# Patient Record
Sex: Female | Born: 1995 | Race: White | Hispanic: No | Smoking: Former smoker
Health system: Southern US, Community
[De-identification: ages and names within clinical notes are randomized; demographics above are authoritative.]

## PROBLEM LIST (undated history)

## (undated) DIAGNOSIS — F419 Anxiety disorder, unspecified: Secondary | ICD-10-CM

## (undated) DIAGNOSIS — J45909 Unspecified asthma, uncomplicated: Secondary | ICD-10-CM

## (undated) DIAGNOSIS — F32A Depression, unspecified: Secondary | ICD-10-CM

## (undated) DIAGNOSIS — F329 Major depressive disorder, single episode, unspecified: Secondary | ICD-10-CM

## (undated) HISTORY — PX: NO PAST SURGERIES: SHX2092

## (undated) HISTORY — PX: WISDOM TOOTH EXTRACTION: SHX21

---
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
## 2017-03-28 NOTE — L&D Delivery Note (Signed)
Delivery Note At 6:34 PM a viable female infant was delivered via Vaginal, Spontaneous (Presentation: ROA).  APGAR: 7, 9; weight 7 lb 3.3 oz (3270 g).   Placenta status: delivered, spontaneously intact.  Cord: 3VC. with the following complications: none.  Cord pH: n/a  Anesthesia:  epidural Episiotomy: None Lacerations: 2nd degree;Perineal;Vaginal Suture Repair: 3.0 vicryl Est. Blood Loss (mL): 500  Mom to postpartum.  Baby to Couplet care / Skin to Skin.  Called to see patient.  Mom pushed to deliver a viable female infant.  The head followed by shoulders, which delivered without difficulty, and the rest of the body.  A single tight nuchal cord noted and delivered through.  Baby to mom's chest.  Cord clamped and cut after > 1 min delay.  Cord blood obtained.  Placenta delivered spontaneously, intact, with a 3-vessel cord.  Second degree perineal and vaginal lacerations repaired with 3-0 Vicryl in standard fashion.  All counts correct.  Hemostasis obtained with IV pitocin and fundal massage. EBL 500 mL.     Thomasene MohairStephen Maison Kestenbaum, MD 11/02/2017, 7:00 PM

## 2017-06-19 ENCOUNTER — Encounter: Payer: Self-pay | Admitting: Advanced Practice Midwife

## 2017-06-28 ENCOUNTER — Ambulatory Visit (INDEPENDENT_AMBULATORY_CARE_PROVIDER_SITE_OTHER): Payer: Medicaid Other | Admitting: Advanced Practice Midwife

## 2017-06-28 ENCOUNTER — Encounter: Payer: Self-pay | Admitting: Advanced Practice Midwife

## 2017-06-28 VITALS — BP 110/64 | Ht 66.0 in | Wt 128.0 lb

## 2017-06-28 DIAGNOSIS — O0990 Supervision of high risk pregnancy, unspecified, unspecified trimester: Secondary | ICD-10-CM

## 2017-06-28 DIAGNOSIS — Z113 Encounter for screening for infections with a predominantly sexual mode of transmission: Secondary | ICD-10-CM

## 2017-06-28 DIAGNOSIS — O0930 Supervision of pregnancy with insufficient antenatal care, unspecified trimester: Secondary | ICD-10-CM

## 2017-06-28 DIAGNOSIS — F332 Major depressive disorder, recurrent severe without psychotic features: Secondary | ICD-10-CM

## 2017-06-28 NOTE — Progress Notes (Signed)
New Obstetric Patient H&P    Chief Complaint: "Desires prenatal care"   History of Present Illness: Patient is a 22 y.o. G1P0 Not Hispanic or Latino female, presents with amenorrhea and positive home pregnancy test. Patient's last menstrual period was 02/14/2017 (lmp unknown). and based on her  LMP, her EDD is Estimated Date of Delivery: 11/21/17 and her EGA is [redacted]w[redacted]d. Cycles are 5. days, regular, and occur approximately every : 28 days. She has never had a PAP smear due to age.   She had a urine pregnancy test which was positive about 3 month(s)  ago. Her last menstrual period was normal and lasted for  4 or 5 day(s). Since her LMP she claims she has experienced fatigue, nausea, vomiting. She denies vaginal bleeding. Her past medical history is contributory for anxiety, depression, eating disorder. She has been off all meds for about 2 years and denies current problems with eating, depression or anxiety. Discussed hormonal mood disorders of pregnancy and stressed the importance of keeping [place] informed of worsening symptoms. This is her first pregnancy.  Since her LMP, she admits to the use of tobacco products  She quit when she found out she was pregnant She claims she has gained   13 pounds since the start of her pregnancy.  There are cats in the home in the home  yes If yes Indoor She admits close contact with children on a regular basis  no  She has had chicken pox in the past no She has had Tuberculosis exposures, symptoms, or previously tested positive for TB   no Current or past history of domestic violence. no  Genetic Screening/Teratology Counseling: (Includes patient, baby's father, or anyone in either family with:)   1. Patient's age >/= 47 at [facility]  no 2. Thalassemia ([place], [place], Mediterranean, or Asian background): MCV<80  no 3. Neural tube defect (meningomyelocele, spina bifida, anencephaly)  no 4. Congenital heart defect  Her brother may have had a heart defect 5. Down  syndrome  no 6. Tay-Sachs (Jewish, [place]  no 7. Canavan's Disease  no 8. Sickle cell disease or trait (African)  no  9. Hemophilia or other blood disorders  no  10. Muscular dystrophy  no  11. Cystic fibrosis  no  12. Huntington's Chorea  no  13. Mental retardation/autism  no 14. Other inherited genetic or chromosomal disorder  no 15. Maternal metabolic disorder (DM, PKU, etc)  no 16. Patient or FOB with a child with a birth defect not listed above no  16a. Patient or FOB with a birth defect themselves no 17. Recurrent pregnancy loss, or stillbirth  no  18. Any medications since LMP other than prenatal vitamins (include vitamins, supplements, OTC meds, drugs, alcohol)  no 19. Any other genetic/environmental exposure to discuss  no  Infection History:   1. Lives with someone with TB or TB exposed  no  2. Patient or partner has history of genital herpes  no 3. Rash or viral illness since LMP  no 4. History of STI (GC, CT, HPV, syphilis, HIV)  no 5. History of recent travel :  no  Other pertinent information:  no    Review of Systems:10 point review of systems negative unless otherwise noted in HPI  Past Medical History:  Past Medical History:  Diagnosis Date  . Anxiety   . Asthma    pt states she has not taken medications for this since age 69  . Depression     Past Surgical  History:  History reviewed. No pertinent surgical history.  Gynecologic History: Patient's last menstrual period was 02/14/2017 (lmp unknown).  Obstetric History: G1P0  Family History:  Family History  Adopted: Yes  Problem Relation Age of Onset  . Breast cancer Maternal Grandmother   . Diabetes Mellitus II Maternal Grandmother     Social History:  Social History   Socioeconomic History  . Marital status: Single    Spouse name: Not on file  . Number of children: Not on file  . Years of education: Not on file  . Highest education level: Not on file  Occupational History  .  Not on file  Social Needs  . Financial resource strain: Not on file  . Food insecurity:    Worry: Not on file    Inability: Not on file  . Transportation needs:    Medical: Not on file    Non-medical: Not on file  Tobacco Use  . Smoking status: Former Games developermoker  . Smokeless tobacco: Never Used  Substance and Sexual Activity  . Alcohol use: No  . Drug use: No  . Sexual activity: Yes    Birth control/protection: None  Lifestyle  . Physical activity:    Days per week: Not on file    Minutes per session: Not on file  . Stress: Not on file  Relationships  . Social connections:    Talks on phone: Not on file    Gets together: Not on file    Attends religious service: Not on file    Active member of club or organization: Not on file    Attends meetings of clubs or organizations: Not on file    Relationship status: Not on file  . Intimate partner violence:    Fear of current or ex partner: Not on file    Emotionally abused: Not on file    Physically abused: Not on file    Forced sexual activity: Not on file  Other Topics Concern  . Not on file  Social History Narrative  . Not on file    Allergies:  No Known Allergies  Medications: Prior to Admission medications   Not on File    Physical Exam Vitals: Blood pressure 110/64, weight 128 lb (58.1 kg), last menstrual period 03/13/2017.  General: NAD HEENT: normocephalic, anicteric Thyroid: no enlargement, no palpable nodules Pulmonary: No increased work of breathing, CTAB Cardiovascular: RRR, distal pulses 2+ Abdomen: NABS, soft, non-tender, non-distended.  Umbilicus without lesions.  No hepatomegaly, splenomegaly or masses palpable. No evidence of hernia. Fetal heart tones 150s Genitourinary:  External: Normal external female genitalia.  Normal urethral meatus, normal  Bartholin's and Skene's glands.    Vagina: Normal vaginal mucosa, no evidence of prolapse.    Cervix: Grossly normal in appearance, no bleeding, no  CMT  Uterus: Enlarged by external palpation at U    Adnexa: deferred for no symptoms/shared decision making  Rectal: deferred Extremities: no edema, erythema, or tenderness Neurologic: Grossly intact Psychiatric: mood appropriate, affect full   Assessment: 22 y.o. G1P0 at 166w1d presenting to initiate prenatal care  Plan: 1) Avoid alcoholic beverages. 2) Patient encouraged not to smoke.  3) Discontinue the use of all non-medicinal drugs and chemicals.  4) Take prenatal vitamins daily.  5) Nutrition, food safety (fish, cheese advisories, and high nitrite foods) and exercise discussed. 6) Hospital and practice style discussed with cross coverage system.  7) Genetic Screening, such as with 1st Trimester Screening, cell free fetal DNA, AFP testing, and Ultrasound, as  well as with amniocentesis and CVS as appropriate, is discussed with patient. At the conclusion of today's visit patient requested genetic testing 8) Patient is asked about travel to areas at risk for the Zika virus, and counseled to avoid travel and exposure to mosquitoes or sexual partners who may have themselves been exposed to the virus. Testing is discussed, and will be ordered as appropriate.  9 Anatomy scan in 1 week and can offer cfDNA following dating of pregnancy   Tresea Mall, [facility], [facility] 06/28/2017, 3:35 PM

## 2017-06-28 NOTE — Patient Instructions (Addendum)

## 2017-06-28 NOTE — Progress Notes (Signed)
NOB today. No vb. No lof. Pt unsure of LMP

## 2017-06-29 LAB — RPR+RH+ABO+RUB AB+AB SCR+CB...
ANTIBODY SCREEN: NEGATIVE
HEMATOCRIT: 33.7 % — AB (ref 34.0–46.6)
HIV Screen 4th Generation wRfx: NONREACTIVE
Hemoglobin: 11.4 g/dL (ref 11.1–15.9)
Hepatitis B Surface Ag: NEGATIVE
MCH: 30.2 pg (ref 26.6–33.0)
MCHC: 33.8 g/dL (ref 31.5–35.7)
MCV: 89 fL (ref 79–97)
Platelets: 300 10*3/uL (ref 150–379)
RBC: 3.78 x10E6/uL (ref 3.77–5.28)
RDW: 13.4 % (ref 12.3–15.4)
RH TYPE: POSITIVE
RPR Ser Ql: NONREACTIVE
Rubella Antibodies, IGG: 1.25 index (ref >0.99)
Varicella zoster IgG: 343 index (ref >165)
WBC: 6.6 10*3/uL (ref 3.4–10.8)

## 2017-07-02 LAB — URINE DRUG PANEL 7
Amphetamines, Urine: NEGATIVE ng/mL
BARBITURATE QUANT UR: NEGATIVE ng/mL
BENZODIAZEPINE QUANT UR: NEGATIVE ng/mL
CANNABINOID QUANT UR: POSITIVE — AB
Cocaine (Metab.): NEGATIVE ng/mL
OPIATE QUANT UR: NEGATIVE ng/mL
PCP Quant, Ur: NEGATIVE ng/mL

## 2017-07-02 LAB — CHLAMYDIA/GONOCOCCUS/TRICHOMONAS, NAA
Chlamydia by NAA: NEGATIVE
GONOCOCCUS BY NAA: NEGATIVE
Trich vag by NAA: NEGATIVE

## 2017-07-02 LAB — URINE CULTURE

## 2017-07-26 ENCOUNTER — Encounter: Payer: Self-pay | Admitting: Certified Nurse Midwife

## 2017-07-26 ENCOUNTER — Ambulatory Visit (INDEPENDENT_AMBULATORY_CARE_PROVIDER_SITE_OTHER): Payer: Medicaid Other

## 2017-07-26 ENCOUNTER — Ambulatory Visit (INDEPENDENT_AMBULATORY_CARE_PROVIDER_SITE_OTHER): Payer: Medicaid Other | Admitting: Certified Nurse Midwife

## 2017-07-26 VITALS — BP 102/58 | Wt 129.0 lb

## 2017-07-26 DIAGNOSIS — O0990 Supervision of high risk pregnancy, unspecified, unspecified trimester: Secondary | ICD-10-CM

## 2017-07-26 DIAGNOSIS — Z3A26 26 weeks gestation of pregnancy: Secondary | ICD-10-CM | POA: Diagnosis not present

## 2017-07-26 DIAGNOSIS — O0930 Supervision of pregnancy with insufficient antenatal care, unspecified trimester: Secondary | ICD-10-CM | POA: Diagnosis not present

## 2017-07-26 DIAGNOSIS — Z131 Encounter for screening for diabetes mellitus: Secondary | ICD-10-CM

## 2017-07-26 NOTE — Progress Notes (Signed)
ROB and anatomy scan at 23wk1d: +FM. Denies problems Reviewed lab results from prior visit. Did not discuss +MJ use because her father in room.\ CGA 26wk2day changing her EDC to 10/30/2017, normal but incomplete anatomy scan  Need face and cardiac views  Given information on prenatal classes ROB, 1 hour GTT, and FU anatomy scan in 2 weeks.  Farrel Conners, CNM

## 2017-07-26 NOTE — Progress Notes (Signed)
Pt reports no problems. Anatomy/dating ultrasound today. Gender surprise for now.

## 2017-08-09 ENCOUNTER — Other Ambulatory Visit: Payer: Medicaid Other

## 2017-08-09 ENCOUNTER — Ambulatory Visit (INDEPENDENT_AMBULATORY_CARE_PROVIDER_SITE_OTHER): Payer: Medicaid Other

## 2017-08-09 ENCOUNTER — Ambulatory Visit (INDEPENDENT_AMBULATORY_CARE_PROVIDER_SITE_OTHER): Payer: Medicaid Other | Admitting: Advanced Practice Midwife

## 2017-08-09 ENCOUNTER — Encounter: Payer: Self-pay | Admitting: Advanced Practice Midwife

## 2017-08-09 VITALS — BP 120/60 | Wt 135.0 lb

## 2017-08-09 DIAGNOSIS — O0990 Supervision of high risk pregnancy, unspecified, unspecified trimester: Secondary | ICD-10-CM

## 2017-08-09 DIAGNOSIS — Z3A28 28 weeks gestation of pregnancy: Secondary | ICD-10-CM

## 2017-08-09 DIAGNOSIS — O0993 Supervision of high risk pregnancy, unspecified, third trimester: Secondary | ICD-10-CM | POA: Diagnosis not present

## 2017-08-09 DIAGNOSIS — Z131 Encounter for screening for diabetes mellitus: Secondary | ICD-10-CM

## 2017-08-09 NOTE — Patient Instructions (Signed)
Third Trimester of Pregnancy The third trimester is from week 28 through week 40 (months 7 through 9). The third trimester is a time when the unborn baby (fetus) is growing rapidly. At the end of the ninth month, the fetus is about 20 inches in length and weighs 6-10 pounds. Body changes during your third trimester Your body will continue to go through many changes during pregnancy. The changes vary from woman to woman. During the third trimester:  Your weight will continue to increase. You can expect to gain 25-35 pounds (11-16 kg) by the end of the pregnancy.  You may begin to get stretch marks on your hips, abdomen, and breasts.  You may urinate more often because the fetus is moving lower into your pelvis and pressing on your bladder.  You may develop or continue to have heartburn. This is caused by increased hormones that slow down muscles in the digestive tract.  You may develop or continue to have constipation because increased hormones slow digestion and cause the muscles that push waste through your intestines to relax.  You may develop hemorrhoids. These are swollen veins (varicose veins) in the rectum that can itch or be painful.  You may develop swollen, bulging veins (varicose veins) in your legs.  You may have increased body aches in the pelvis, back, or thighs. This is due to weight gain and increased hormones that are relaxing your joints.  You may have changes in your hair. These can include thickening of your hair, rapid growth, and changes in texture. Some women also have hair loss during or after pregnancy, or hair that feels dry or thin. Your hair will most likely return to normal after your baby is born.  Your breasts will continue to grow and they will continue to become tender. A yellow fluid (colostrum) may leak from your breasts. This is the first milk you are producing for your baby.  Your belly button may stick out.  You may notice more swelling in your hands,  face, or ankles.  You may have increased tingling or numbness in your hands, arms, and legs. The skin on your belly may also feel numb.  You may feel short of breath because of your expanding uterus.  You may have more problems sleeping. This can be caused by the size of your belly, increased need to urinate, and an increase in your body's metabolism.  You may notice the fetus "dropping," or moving lower in your abdomen (lightening).  You may have increased vaginal discharge.  You may notice your joints feel loose and you may have pain around your pelvic bone.  What to expect at prenatal visits You will have prenatal exams every 2 weeks until week 36. Then you will have weekly prenatal exams. During a routine prenatal visit:  You will be weighed to make sure you and the baby are growing normally.  Your blood pressure will be taken.  Your abdomen will be measured to track your baby's growth.  The fetal heartbeat will be listened to.  Any test results from the previous visit will be discussed.  You may have a cervical check near your due date to see if your cervix has softened or thinned (effaced).  You will be tested for Group B streptococcus. This happens between 35 and 37 weeks.  Your health care provider may ask you:  What your birth plan is.  How you are feeling.  If you are feeling the baby move.  If you have had   any abnormal symptoms, such as leaking fluid, bleeding, severe headaches, or abdominal cramping.  If you are using any tobacco products, including cigarettes, chewing tobacco, and electronic cigarettes.  If you have any questions.  Other tests or screenings that may be performed during your third trimester include:  Blood tests that check for low iron levels (anemia).  Fetal testing to check the health, activity level, and growth of the fetus. Testing is done if you have certain medical conditions or if there are problems during the  pregnancy.  Nonstress test (NST). This test checks the health of your baby to make sure there are no signs of problems, such as the baby not getting enough oxygen. During this test, a belt is placed around your belly. The baby is made to move, and its heart rate is monitored during movement.  What is false labor? False labor is a condition in which you feel small, irregular tightenings of the muscles in the womb (contractions) that usually go away with rest, changing position, or drinking water. These are called Braxton Hicks contractions. Contractions may last for hours, days, or even weeks before true labor sets in. If contractions come at regular intervals, become more frequent, increase in intensity, or become painful, you should see your health care provider. What are the signs of labor?  Abdominal cramps.  Regular contractions that start at 10 minutes apart and become stronger and more frequent with time.  Contractions that start on the top of the uterus and spread down to the lower abdomen and back.  Increased pelvic pressure and dull back pain.  A watery or bloody mucus discharge that comes from the vagina.  Leaking of amniotic fluid. This is also known as your "water breaking." It could be a slow trickle or a gush. Let your health care provider know if it has a color or strange odor. If you have any of these signs, call your health care provider right away, even if it is before your due date. Follow these instructions at home: Medicines  Follow your health care provider's instructions regarding medicine use. Specific medicines may be either safe or unsafe to take during pregnancy.  Take a prenatal vitamin that contains at least 600 micrograms (mcg) of folic acid.  If you develop constipation, try taking a stool softener if your health care provider approves. Eating and drinking  Eat a balanced diet that includes fresh fruits and vegetables, whole grains, good sources of protein  such as meat, eggs, or tofu, and low-fat dairy. Your health care provider will help you determine the amount of weight gain that is right for you.  Avoid raw meat and uncooked cheese. These carry germs that can cause birth defects in the baby.  If you have low calcium intake from food, talk to your health care provider about whether you should take a daily calcium supplement.  Eat four or five small meals rather than three large meals a day.  Limit foods that are high in fat and processed sugars, such as fried and sweet foods.  To prevent constipation: ? Drink enough fluid to keep your urine clear or pale yellow. ? Eat foods that are high in fiber, such as fresh fruits and vegetables, whole grains, and beans. Activity  Exercise only as directed by your health care provider. Most women can continue their usual exercise routine during pregnancy. Try to exercise for 30 minutes at least 5 days a week. Stop exercising if you experience uterine contractions.  Avoid heavy   lifting.  Do not exercise in extreme heat or humidity, or at high altitudes.  Wear low-heel, comfortable shoes.  Practice good posture.  You may continue to have sex unless your health care provider tells you otherwise. Relieving pain and discomfort  Take frequent breaks and rest with your legs elevated if you have leg cramps or low back pain.  Take warm sitz baths to soothe any pain or discomfort caused by hemorrhoids. Use hemorrhoid cream if your health care provider approves.  Wear a good support bra to prevent discomfort from breast tenderness.  If you develop varicose veins: ? Wear support pantyhose or compression stockings as told by your healthcare provider. ? Elevate your feet for 15 minutes, 3-4 times a day. Prenatal care  Write down your questions. Take them to your prenatal visits.  Keep all your prenatal visits as told by your health care provider. This is important. Safety  Wear your seat belt at  all times when driving.  Make a list of emergency phone numbers, including numbers for family, friends, the hospital, and police and fire departments. General instructions  Avoid cat litter boxes and soil used by cats. These carry germs that can cause birth defects in the baby. If you have a cat, ask someone to clean the litter box for you.  Do not travel far distances unless it is absolutely necessary and only with the approval of your health care provider.  Do not use hot tubs, steam rooms, or saunas.  Do not drink alcohol.  Do not use any products that contain nicotine or tobacco, such as cigarettes and e-cigarettes. If you need help quitting, ask your health care provider.  Do not use any medicinal herbs or unprescribed drugs. These chemicals affect the formation and growth of the baby.  Do not douche or use tampons or scented sanitary pads.  Do not cross your legs for long periods of time.  To prepare for the arrival of your baby: ? Take prenatal classes to understand, practice, and ask questions about labor and delivery. ? Make a trial run to the hospital. ? Visit the hospital and tour the maternity area. ? Arrange for maternity or paternity leave through employers. ? Arrange for family and friends to take care of pets while you are in the hospital. ? Purchase a rear-facing car seat and make sure you know how to install it in your car. ? Pack your hospital bag. ? Prepare the baby's nursery. Make sure to remove all pillows and stuffed animals from the baby's crib to prevent suffocation.  Visit your dentist if you have not gone during your pregnancy. Use a soft toothbrush to brush your teeth and be gentle when you floss. Contact a health care provider if:  You are unsure if you are in labor or if your water has broken.  You become dizzy.  You have mild pelvic cramps, pelvic pressure, or nagging pain in your abdominal area.  You have lower back pain.  You have persistent  nausea, vomiting, or diarrhea.  You have an unusual or bad smelling vaginal discharge.  You have pain when you urinate. Get help right away if:  Your water breaks before 37 weeks.  You have regular contractions less than 5 minutes apart before 37 weeks.  You have a fever.  You are leaking fluid from your vagina.  You have spotting or bleeding from your vagina.  You have severe abdominal pain or cramping.  You have rapid weight loss or weight gain.    You have shortness of breath with chest pain.  You notice sudden or extreme swelling of your face, hands, ankles, feet, or legs.  Your baby makes fewer than 10 movements in 2 hours.  You have severe headaches that do not go away when you take medicine.  You have vision changes. Summary  The third trimester is from week 28 through week 40, months 7 through 9. The third trimester is a time when the unborn baby (fetus) is growing rapidly.  During the third trimester, your discomfort may increase as you and your baby continue to gain weight. You may have abdominal, leg, and back pain, sleeping problems, and an increased need to urinate.  During the third trimester your breasts will keep growing and they will continue to become tender. A yellow fluid (colostrum) may leak from your breasts. This is the first milk you are producing for your baby.  False labor is a condition in which you feel small, irregular tightenings of the muscles in the womb (contractions) that eventually go away. These are called Braxton Hicks contractions. Contractions may last for hours, days, or even weeks before true labor sets in.  Signs of labor can include: abdominal cramps; regular contractions that start at 10 minutes apart and become stronger and more frequent with time; watery or bloody mucus discharge that comes from the vagina; increased pelvic pressure and dull back pain; and leaking of amniotic fluid. This information is not intended to replace advice  given to you by your health care provider. Make sure you discuss any questions you have with your health care provider. Document Released: 03/08/2001 Document Revised: 08/20/2015 Document Reviewed: 05/15/2012 Elsevier Interactive Patient Education  2017 [employer]vier Inc.  

## 2017-08-09 NOTE — Progress Notes (Signed)
  Routine Prenatal Care Visit  Subjective  Amy [name] is a 22 y.o. G1P0 at [redacted]w[redacted]d being seen today for ongoing prenatal care.  She is currently monitored for the following issues for this high-risk pregnancy and has Severe recurrent major depression without psychotic features (HCC); Eating disorder; Generalized anxiety disorder; Supervision of high risk pregnancy, antepartum; and Late prenatal care on their problem list.  ----------------------------------------------------------------------------------- Patient reports no complaints.  She admits to having an appetite and no problems with eating. She denies continued marijuana use. Contractions: Not present. Vag. Bleeding: None.  Movement: Present. Denies leaking of fluid.  ----------------------------------------------------------------------------------- The following portions of the patient's history were reviewed and updated as appropriate: allergies, current medications, past family history, past medical history, past social history, past surgical history and problem list. Problem list updated.   Objective  Blood pressure 120/60, weight 135 lb (61.2 kg), last menstrual period 02/14/2017. Pregravid weight 115 lb (52.2 kg) Total Weight Gain 20 lb (9.072 kg) Urinalysis:    dark yellow  Fetal Status: Fetal Heart Rate (bpm): 146 Fundal Height: 28 cm Movement: Present  Presentation: Vertex  Anatomy is complete but sub-optimal for profile due to position of fetus in pelvis. Comment on scan: placenta grade 2.  General:  Alert, oriented and cooperative. Patient is in no acute distress.  Skin: Skin is warm and dry. No rash noted.   Cardiovascular: Normal heart rate noted  Respiratory: Normal respiratory effort, no problems with respiration noted  Abdomen: Soft, gravid, appropriate for gestational age. Pain/Pressure: Present     Pelvic:  Cervical exam deferred        Extremities: Normal range of motion.  Edema: None  Mental Status:  Normal mood and affect. Normal behavior. Normal judgment and thought content.   Assessment   22 y.o. G1P0 at [redacted]w[redacted]d by  10/30/2017, by Ultrasound presenting for routine prenatal visit  Plan   pregnancy Problems (from 06/28/17 to present)    No problems associated with this episode.       Preterm labor symptoms and general obstetric precautions including but not limited to vaginal bleeding, contractions, leaking of fluid and fetal movement were reviewed in detail with the patient. Please refer to After Visit Summary for other counseling recommendations.  Recommend increased hydration. Recommend hands/knees or chest/knees exercises for fetal positioning  Return in about 2 weeks (around 08/23/2017) for f/u growth/anatomy and rob.  Tresea Mall, CNM 08/09/2017 11:23 AM

## 2017-08-10 LAB — GLUCOSE, 1 HOUR GESTATIONAL: Gestational Diabetes Screen: 129 mg/dL (ref 65–139)

## 2017-08-23 ENCOUNTER — Encounter: Payer: Self-pay | Admitting: Maternal Newborn

## 2017-08-23 ENCOUNTER — Ambulatory Visit (INDEPENDENT_AMBULATORY_CARE_PROVIDER_SITE_OTHER): Payer: Medicaid Other

## 2017-08-23 ENCOUNTER — Ambulatory Visit (INDEPENDENT_AMBULATORY_CARE_PROVIDER_SITE_OTHER): Payer: Medicaid Other | Admitting: Maternal Newborn

## 2017-08-23 VITALS — BP 100/60 | Wt 135.0 lb

## 2017-08-23 DIAGNOSIS — O0993 Supervision of high risk pregnancy, unspecified, third trimester: Secondary | ICD-10-CM

## 2017-08-23 DIAGNOSIS — Z3A30 30 weeks gestation of pregnancy: Secondary | ICD-10-CM

## 2017-08-23 DIAGNOSIS — O0990 Supervision of high risk pregnancy, unspecified, unspecified trimester: Secondary | ICD-10-CM

## 2017-08-23 DIAGNOSIS — Z3689 Encounter for other specified antenatal screening: Secondary | ICD-10-CM

## 2017-08-23 NOTE — Progress Notes (Addendum)
Routine Prenatal Care Visit  Subjective  Amy [name] is a 22 y.o. G1P0 at [redacted]w[redacted]d being seen today for ongoing prenatal care.  She is currently monitored for the following issues for this high-risk pregnancy and has Severe recurrent major depression without psychotic features (HCC); Eating disorder; Generalized anxiety disorder; Supervision of high risk pregnancy, antepartum; and Late prenatal care on their problem list.  ----------------------------------------------------------------------------------- Patient reports no complaints.   Contractions: Not present. Vag. Bleeding: None.  Movement: Present. No leaking of fluid.  ----------------------------------------------------------------------------------- The following portions of the patient's history were reviewed and updated as appropriate: allergies, current medications, past family history, past medical history, past social history, past surgical history and problem list. Problem list updated.   Objective  Blood pressure 100/60, weight 135 lb (61.2 kg), last menstrual period 02/14/2017. Pregravid weight 115 lb (52.2 kg) Total Weight Gain 20 lb (9.072 kg) Urinalysis: Urine Protein: Negative Urine Glucose: Negative  Fetal Status: Fetal Heart Rate (bpm): 142 Fundal Height: 29 cm Movement: Present     General:  Alert, oriented and cooperative. Patient is in no acute distress.  Skin: Skin is warm and dry. No rash noted.   Cardiovascular: Normal heart rate noted  Respiratory: Normal respiratory effort, no problems with respiration noted  Abdomen: Soft, gravid, appropriate for gestational age. Pain/Pressure: Absent     Pelvic:  Cervical exam deferred        Extremities: Normal range of motion.  Edema: None  Mental Status: Normal mood and affect. Normal behavior. Normal judgment and thought content.     Assessment   22 y.o. G1P0 at [redacted]w[redacted]d, EDD 10/30/2017 by Ultrasound presenting for routine prenatal visit.  Plan    pregnancy Problems (from 06/28/17 to present)    Problem Noted Resolved   Supervision of high risk pregnancy, antepartum 07/26/2017 by Farrel Conners, CNM No   Overview Addendum 08/10/2017  7:26 PM by Farrel Conners, CNM     Clinic  Prenatal Labs  Dating 26wk2d ultrasound Blood type: O/Positive/-- (04/03 1507)   Genetic Screen 1 Screen:    AFP:     Quad:     NIPS: Antibody:Negative (04/03 1507)  Anatomic [place]  Rubella: 1.25 (04/03 1507) Varicella Immune  GTT Early:               Third trimester: 129 RPR: Non Reactive (04/03 1507)   Flu vaccine  HBsAg: Negative (04/03 1507)   TDaP vaccine                                               Rhogam: HIV: Non Reactive (04/03 1507)   Baby Food                                               GBS: (For PCN allergy, check sensitivities)  Contraception  Pap:  Circumcision  +MJ on UDS 4/3  Pediatrician    Support Person              Follow up anatomy scan today is still incomplete for profile. Growth scan not done today, check with next visit in two weeks. Discussed TDaP, she says she will accept the vaccine next visit.  Preterm labor symptoms and general obstetric precautions including  but not limited to vaginal bleeding, contractions, leaking of fluid and fetal movement were reviewed.  Return in about 2 weeks (around 09/06/2017) for ROB with growth ultrasound.  Marcelyn Bruins, CNM 08/23/2017  3:06 PM

## 2017-08-23 NOTE — Progress Notes (Signed)
No [email or web address] 

## 2017-08-28 ENCOUNTER — Other Ambulatory Visit: Payer: Self-pay | Admitting: Obstetrics and Gynecology

## 2017-08-28 DIAGNOSIS — Z3480 Encounter for supervision of other normal pregnancy, unspecified trimester: Secondary | ICD-10-CM

## 2017-08-28 NOTE — Progress Notes (Signed)
Called patient and discussed that we will refer her to MFM because profile of baby has not been able to be visualized yet. Patient agreed.  Amy Idlerhristanna Destane Speas MD [facility], [facility] 08/28/17 3:04 PM

## 2017-09-04 ENCOUNTER — Other Ambulatory Visit: Payer: Self-pay | Admitting: *Deleted

## 2017-09-04 ENCOUNTER — Ambulatory Visit
Admission: RE | Admit: 2017-09-04 | Discharge: 2017-09-04 | Disposition: A | Discharge status: Home / Self Care | Payer: Medicaid Other | Source: Ambulatory Visit | Attending: Obstetrics and Gynecology | Admitting: Obstetrics and Gynecology

## 2017-09-04 DIAGNOSIS — O0930 Supervision of pregnancy with insufficient antenatal care, unspecified trimester: Secondary | ICD-10-CM

## 2017-09-04 DIAGNOSIS — O0990 Supervision of high risk pregnancy, unspecified, unspecified trimester: Secondary | ICD-10-CM

## 2017-09-04 DIAGNOSIS — Z3480 Encounter for supervision of other normal pregnancy, unspecified trimester: Secondary | ICD-10-CM

## 2017-09-06 ENCOUNTER — Other Ambulatory Visit: Payer: Medicaid Other

## 2017-09-06 ENCOUNTER — Encounter: Payer: Medicaid Other | Admitting: Obstetrics and Gynecology

## 2017-09-11 ENCOUNTER — Ambulatory Visit: Payer: Medicaid Other

## 2017-09-11 ENCOUNTER — Encounter: Payer: Self-pay | Admitting: Obstetrics and Gynecology

## 2017-09-11 ENCOUNTER — Ambulatory Visit (INDEPENDENT_AMBULATORY_CARE_PROVIDER_SITE_OTHER): Payer: Medicaid Other | Admitting: Obstetrics and Gynecology

## 2017-09-11 DIAGNOSIS — O0930 Supervision of pregnancy with insufficient antenatal care, unspecified trimester: Secondary | ICD-10-CM

## 2017-09-11 DIAGNOSIS — O0990 Supervision of high risk pregnancy, unspecified, unspecified trimester: Secondary | ICD-10-CM

## 2017-09-11 DIAGNOSIS — O99323 Drug use complicating pregnancy, third trimester: Secondary | ICD-10-CM

## 2017-09-11 DIAGNOSIS — O0993 Supervision of high risk pregnancy, unspecified, third trimester: Secondary | ICD-10-CM

## 2017-09-11 DIAGNOSIS — O0933 Supervision of pregnancy with insufficient antenatal care, third trimester: Secondary | ICD-10-CM

## 2017-09-11 DIAGNOSIS — O26843 Uterine size-date discrepancy, third trimester: Secondary | ICD-10-CM

## 2017-09-11 DIAGNOSIS — Z3A33 33 weeks gestation of pregnancy: Secondary | ICD-10-CM

## 2017-09-11 NOTE — Progress Notes (Signed)
Routine Prenatal Care Visit  Subjective  Amy [name] is a 22 y.o. G1P0 at [redacted]w[redacted]d being seen today for ongoing prenatal care.  She is currently monitored for the following issues for this high-risk pregnancy and has Severe recurrent major depression without psychotic features (HCC); Eating disorder; Generalized anxiety disorder; Supervision of high risk pregnancy, antepartum; and Late prenatal care on their problem list.  ----------------------------------------------------------------------------------- Patient reports no complaints.   Contractions: Not present. Vag. Bleeding: None.  Movement: Present. Denies leaking of fluid.  ----------------------------------------------------------------------------------- The following portions of the patient's history were reviewed and updated as appropriate: allergies, current medications, past family history, past medical history, past social history, past surgical history and problem list. Problem list updated.   Objective  Blood pressure 100/60, weight 140 lb (63.5 kg), last menstrual period 02/14/2017. Pregravid weight 115 lb (52.2 kg) Total Weight Gain 25 lb (11.3 kg) Urinalysis: Urine Protein: Negative Urine Glucose: Negative  Fetal Status: Fetal Heart Rate (bpm): 140 Fundal Height: 31 cm Movement: Present  Presentation: Vertex  General:  Alert, oriented and cooperative. Patient is in no acute distress.  Skin: Skin is warm and dry. No rash noted.   Cardiovascular: Normal heart rate noted  Respiratory: Normal respiratory effort, no problems with respiration noted  Abdomen: Soft, gravid, appropriate for gestational age. Pain/Pressure: Present     Pelvic:  Cervical exam deferred        Extremities: Normal range of motion.     ental Status: Normal mood and affect. Normal behavior. Normal judgment and thought content.     Assessment   22 y.o. G1P0 at [redacted]w[redacted]d by  10/30/2017, by Ultrasound presenting for routine prenatal  visit  Plan   pregnancy Problems (from 06/28/17 to present)    Problem Noted Resolved   Supervision of high risk pregnancy, antepartum 07/26/2017 by Farrel Conners, CNM No   Overview Addendum 09/11/2017 12:02 PM by Natale Milch, MD     Clinic [facility] Prenatal Labs  Dating 26wk2d ultrasound Blood type: O/Positive/-- (04/03 1507)   Genetic Screen 1 Screen:    AFP:     Quad:     NIPS: Antibody:Negative (04/03 1507)  Anatomic [place] complete Rubella: 1.25 (04/03 1507) Varicella Immune  GTT Early:               Third trimester: 129 RPR: Non Reactive (04/03 1507)   Flu vaccine  HBsAg: Negative (04/03 1507)   TDaP vaccine 09/11/17                                               Rhogam: not needed HIV: Non Reactive (04/03 1507)   Baby Food  Breast                                             GBS: (For PCN allergy, check sensitivities)  Contraception  undecided, given information Pap:  Circumcision  +MJ on UDS 4/3  Pediatrician    Support Person                 Gestational age appropriate obstetric precautions including but not limited to vaginal bleeding, contractions, leaking of fluid and fetal movement were reviewed in detail with the patient.    Tdap today. Given  information on birthcontrol options postpartum. Recommended [email or web address]. She is unsure what method she desires to use right now. Discussed breast feeding. Patient is planning on breast feeding. Patient was given resources and lactation consultation was advised.   Return in about 2 weeks (around 09/25/2017) for ROB and US.   Adelene Idlerhristanna Shoshanna Mcquitty MD [facility], [facility] 09/11/17 12:03 PM

## 2017-09-11 NOTE — Progress Notes (Signed)
Pt reports no problems. Pt to receive tdap at [facility] due to insurance and age. Blood consent today.

## 2017-09-12 LAB — URINE DRUG PANEL 7
AMPHETAMINES, URINE: NEGATIVE ng/mL
BARBITURATE QUANT UR: NEGATIVE ng/mL
BENZODIAZEPINE QUANT UR: NEGATIVE ng/mL
COCAINE (METAB.): NEGATIVE ng/mL
Cannabinoid Quant, Ur: NEGATIVE ng/mL
Opiate Quant, Ur: NEGATIVE ng/mL
PCP Quant, Ur: NEGATIVE ng/mL

## 2017-09-25 ENCOUNTER — Encounter: Payer: Medicaid Other | Admitting: Obstetrics and Gynecology

## 2017-09-25 ENCOUNTER — Other Ambulatory Visit: Payer: Medicaid Other

## 2017-09-27 ENCOUNTER — Ambulatory Visit (INDEPENDENT_AMBULATORY_CARE_PROVIDER_SITE_OTHER): Payer: Medicaid Other

## 2017-09-27 ENCOUNTER — Ambulatory Visit (INDEPENDENT_AMBULATORY_CARE_PROVIDER_SITE_OTHER): Payer: Medicaid Other | Admitting: Certified Nurse Midwife

## 2017-09-27 ENCOUNTER — Other Ambulatory Visit: Payer: Self-pay | Admitting: Obstetrics and Gynecology

## 2017-09-27 VITALS — BP 100/60 | Wt 142.0 lb

## 2017-09-27 DIAGNOSIS — Z3A35 35 weeks gestation of pregnancy: Secondary | ICD-10-CM

## 2017-09-27 DIAGNOSIS — O365930 Maternal care for other known or suspected poor fetal growth, third trimester, not applicable or unspecified: Secondary | ICD-10-CM | POA: Diagnosis not present

## 2017-09-27 DIAGNOSIS — O0990 Supervision of high risk pregnancy, unspecified, unspecified trimester: Secondary | ICD-10-CM

## 2017-09-27 NOTE — Progress Notes (Signed)
Pt reports no problems. F/u growth u/s today.

## 2017-09-28 NOTE — Progress Notes (Signed)
ROB/ growth scan at 35wk2d: EFW 30th% (5#5oz) with AC<5th%, normal Dopplers and normal AFI =16.76cm, grade 3 placenta. [facility] EFW 29th% (3#15oz) with AC at the 10th% and grade 3 placenta. Patient reports good FM. Does not smoke cigarettes. Last UDS negative 09/10/17 Consulted Dr Jerene PitchSchuman: no APTing at this time Having BH contractions Discussed labor precautions, preregistration. ROB 1 week  Amy Connersolleen Seraya [name], CNM

## 2017-10-04 ENCOUNTER — Encounter: Payer: Self-pay | Admitting: Advanced Practice Midwife

## 2017-10-04 ENCOUNTER — Other Ambulatory Visit (HOSPITAL_COMMUNITY)
Admission: RE | Admit: 2017-10-04 | Discharge: 2017-10-04 | Disposition: A | Discharge status: Home / Self Care | Payer: Medicaid Other | Source: Ambulatory Visit | Attending: Advanced Practice Midwife | Admitting: Advanced Practice Midwife

## 2017-10-04 ENCOUNTER — Ambulatory Visit (INDEPENDENT_AMBULATORY_CARE_PROVIDER_SITE_OTHER): Payer: Medicaid Other | Admitting: Advanced Practice Midwife

## 2017-10-04 VITALS — BP 116/62 | Wt 149.0 lb

## 2017-10-04 DIAGNOSIS — Z3A36 36 weeks gestation of pregnancy: Secondary | ICD-10-CM

## 2017-10-04 DIAGNOSIS — Z113 Encounter for screening for infections with a predominantly sexual mode of transmission: Secondary | ICD-10-CM | POA: Diagnosis present

## 2017-10-04 DIAGNOSIS — Z3685 Encounter for antenatal screening for Streptococcus B: Secondary | ICD-10-CM

## 2017-10-04 NOTE — Progress Notes (Signed)
Routine Prenatal Care Visit  Subjective  Amy [name] is a 22 y.o. G1P0 at 4138w2d being seen today for ongoing prenatal care.  She is currently monitored for the following issues for this high-risk pregnancy and has Severe recurrent major depression without psychotic features (HCC); Eating disorder; Generalized anxiety disorder; Supervision of high risk pregnancy, antepartum; and Late prenatal care on their problem list.  ----------------------------------------------------------------------------------- Patient reports no complaints.   Contractions: Not present. Vag. Bleeding: None.  Movement: Present. Denies leaking of fluid.  ----------------------------------------------------------------------------------- The following portions of the patient's history were reviewed and updated as appropriate: allergies, current medications, past family history, past medical history, past social history, past surgical history and problem list. Problem list updated.   Objective  Blood pressure 116/62, weight 149 lb (67.6 kg), last menstrual period 02/14/2017. Pregravid weight 115 lb (52.2 kg) Total Weight Gain 34 lb (15.4 kg) Urinalysis:      Fetal Status: Fetal Heart Rate (bpm): 138 Fundal Height: 35 cm Movement: Present     General:  Alert, oriented and cooperative. Patient is in no acute distress.  Skin: Skin is warm and dry. No rash noted.   Cardiovascular: Normal heart rate noted  Respiratory: Normal respiratory effort, no problems with respiration noted  Abdomen: Soft, gravid, appropriate for gestational age. Pain/Pressure: Absent     Pelvic:  GBS/aptima swabs collected        Extremities: Normal range of motion.  Edema: None  Mental Status: Normal mood and affect. Normal behavior. Normal judgment and thought content.   Assessment   22 y.o. G1P0 at 2338w2d by  10/30/2017, by Ultrasound presenting for routine prenatal visit  Plan   pregnancy Problems (from 06/28/17 to present)      Problem Noted Resolved   Supervision of high risk pregnancy, antepartum 07/26/2017 by Farrel ConnersGutierrez, Colleen, CNM No   Overview Addendum 09/11/2017 12:02 PM by Natale MilchSchuman, Christanna R, MD     Clinic [facility] Prenatal Labs  Dating 26wk2d ultrasound Blood type: O/Positive/-- (04/03 1507)   Genetic Screen 1 Screen:    AFP:     Quad:     NIPS: Antibody:Negative (04/03 1507)  Anatomic US complete Rubella: 1.25 (04/03 1507) Varicella Immune  GTT Early:               Third trimester: 129 RPR: Non Reactive (04/03 1507)   Flu vaccine  HBsAg: Negative (04/03 1507)   TDaP vaccine 09/11/17                                               Rhogam: not needed HIV: Non Reactive (04/03 1507)   Baby Food  Breast                                             GBS: (For PCN allergy, check sensitivities)  Contraception  undecided, given information Pap:  Circumcision  +MJ on UDS 4/3  Pediatrician    Support Person                 Preterm labor symptoms and general obstetric precautions including but not limited to vaginal bleeding, contractions, leaking of fluid and fetal movement were reviewed in detail with the patient. Please refer to After Visit Summary for other counseling  recommendations.   Return in about 1 week (around 10/11/2017) for rob.  Tresea Mall, CNM 10/04/2017 11:16 AM

## 2017-10-04 NOTE — Patient Instructions (Signed)
Vaginal Delivery Vaginal delivery means that you will give birth by pushing your baby out of your birth canal (vagina). A team of health care providers will help you before, during, and after vaginal delivery. Birth experiences are unique for every woman and every pregnancy, and birth experiences vary depending on where you choose to give birth. What should I do to prepare for my baby's birth? Before your baby is born, it is important to talk with your health care provider about:  Your labor and delivery preferences. These may include: ? Medicines that you may be given. ? How you will manage your pain. This might include non-medical pain relief techniques or injectable pain relief such as epidural analgesia. ? How you and your baby will be monitored during labor and delivery. ? Who may be in the labor and delivery room with you. ? Your feelings about surgical delivery of your baby (cesarean delivery, or C-section) if this becomes necessary. ? Your feelings about receiving donated blood through an IV tube (blood transfusion) if this becomes necessary.  Whether you are able: ? To take pictures or videos of the birth. ? To eat during labor and delivery. ? To move around, walk, or change positions during labor and delivery.  What to expect after your baby is born, such as: ? Whether delayed umbilical cord clamping and cutting is offered. ? Who will care for your baby right after birth. ? Medicines or tests that may be recommended for your baby. ? Whether breastfeeding is supported in your hospital or birth center. ? How long you will be in the hospital or birth center.  How any medical conditions you have may affect your baby or your labor and delivery experience.  To prepare for your baby's birth, you should also:  Attend all of your health care visits before delivery (prenatal visits) as recommended by your health care provider. This is important.  Prepare your home for your baby's  arrival. Make sure that you have: ? Diapers. ? Baby clothing. ? Feeding equipment. ? Safe sleeping arrangements for you and your baby.  Install a car seat in your vehicle. Have your car seat checked by a certified car seat installer to make sure that it is installed safely.  Think about who will help you with your new baby at home for at least the first several weeks after delivery.  What can I expect when I arrive at the birth center or hospital? Once you are in labor and have been admitted into the hospital or birth center, your health care provider may:  Review your pregnancy history and any concerns you have.  Insert an IV tube into one of your veins. This is used to give you fluids and medicines.  Check your blood pressure, pulse, temperature, and heart rate (vital signs).  Check whether your bag of water (amniotic sac) has broken (ruptured).  Talk with you about your birth plan and discuss pain control options.  Monitoring Your health care provider may monitor your contractions (uterine monitoring) and your baby's heart rate (fetal monitoring). You may need to be monitored:  Often, but not continuously (intermittently).  All the time or for long periods at a time (continuously). Continuous monitoring may be needed if: ? You are taking certain medicines, such as medicine to relieve pain or make your contractions stronger. ? You have pregnancy or labor complications.  Monitoring may be done by:  Placing a special stethoscope or a handheld monitoring device on your abdomen to   check your baby's heartbeat, and feeling your abdomen for contractions. This method of monitoring does not continuously record your baby's heartbeat or your contractions.  Placing monitors on your abdomen (external monitors) to record your baby's heartbeat and the frequency and length of contractions. You may not have to wear external monitors all the time.  Placing monitors inside of your uterus  (internal monitors) to record your baby's heartbeat and the frequency, length, and strength of your contractions. ? Your health care provider may use internal monitors if he or she needs more information about the strength of your contractions or your baby's heart rate. ? Internal monitors are put in place by passing a thin, flexible wire through your vagina and into your uterus. Depending on the type of monitor, it may remain in your uterus or on your baby's head until birth. ? Your health care provider will discuss the benefits and risks of internal monitoring with you and will ask for your permission before inserting the monitors.  Telemetry. This is a type of continuous monitoring that can be done with external or internal monitors. Instead of having to stay in bed, you are able to move around during telemetry. Ask your health care provider if telemetry is an option for you.  Physical exam Your health care provider may perform a physical exam. This may include:  Checking whether your baby is positioned: ? With the head toward your vagina (head-down). This is most common. ? With the head toward the top of your uterus (head-up or breech). If your baby is in a breech position, your health care provider may try to turn your baby to a head-down position so you can deliver vaginally. If it does not seem that your baby can be born vaginally, your provider may recommend surgery to deliver your baby. In rare cases, you may be able to deliver vaginally if your baby is head-up (breech delivery). ? Lying sideways (transverse). Babies that are lying sideways cannot be delivered vaginally.  Checking your cervix to determine: ? Whether it is thinning out (effacing). ? Whether it is opening up (dilating). ? How low your baby has moved into your birth canal.  What are the three stages of labor and delivery?  Normal labor and delivery is divided into the following three stages: Stage 1  Stage 1 is the  longest stage of labor, and it can last for hours or days. Stage 1 includes: ? Early labor. This is when contractions may be irregular, or regular and mild. Generally, early labor contractions are more than 10 minutes apart. ? Active labor. This is when contractions get longer, more regular, more frequent, and more intense. ? The transition phase. This is when contractions happen very close together, are very intense, and may last longer than during any other part of labor.  Contractions generally feel mild, infrequent, and irregular at first. They get stronger, more frequent (about every 2-3 minutes), and more regular as you progress from early labor through active labor and transition.  Many women progress through stage 1 naturally, but you may need help to continue making progress. If this happens, your health care provider may talk with you about: ? Rupturing your amniotic sac if it has not ruptured yet. ? Giving you medicine to help make your contractions stronger and more frequent.  Stage 1 ends when your cervix is completely dilated to 4 inches (10 cm) and completely effaced. This happens at the end of the transition phase. Stage 2  Once   your cervix is completely effaced and dilated to 4 inches (10 cm), you may start to feel an urge to push. It is common for the body to naturally take a rest before feeling the urge to push, especially if you received an epidural or certain other pain medicines. This rest period may last for up to 1-2 hours, depending on your unique labor experience.  During stage 2, contractions are generally less painful, because pushing helps relieve contraction pain. Instead of contraction pain, you may feel stretching and burning pain, especially when the widest part of your baby's head passes through the vaginal opening (crowning).  Your health care provider will closely monitor your pushing progress and your baby's progress through the vagina during stage 2.  Your  health care provider may massage the area of skin between your vaginal opening and anus (perineum) or apply warm compresses to your perineum. This helps it stretch as the baby's head starts to crown, which can help prevent perineal tearing. ? In some cases, an incision may be made in your perineum (episiotomy) to allow the baby to pass through the vaginal opening. An episiotomy helps to make the opening of the vagina larger to allow more room for the baby to fit through.  It is very important to breathe and focus so your health care provider can control the delivery of your baby's head. Your health care provider may have you decrease the intensity of your pushing, to help prevent perineal tearing.  After delivery of your baby's head, the shoulders and the rest of the body generally deliver very quickly and without difficulty.  Once your baby is delivered, the umbilical cord may be cut right away, or this may be delayed for 1-2 minutes, depending on your baby's health. This may vary among health care providers, hospitals, and birth centers.  If you and your baby are healthy enough, your baby may be placed on your chest or abdomen to help maintain the baby's temperature and to help you bond with each other. Some mothers and babies start breastfeeding at this time. Your health care team will dry your baby and help keep your baby warm during this time.  Your baby may need immediate care if he or she: ? Showed signs of distress during labor. ? Has a medical condition. ? Was born too early (prematurely). ? Had a bowel movement before birth (meconium). ? Shows signs of difficulty transitioning from being inside the uterus to being outside of the uterus. If you are planning to breastfeed, your health care team will help you begin a feeding. Stage 3  The third stage of labor starts immediately after the birth of your baby and ends after you deliver the placenta. The placenta is an organ that develops  during pregnancy to provide oxygen and nutrients to your baby in the womb.  Delivering the placenta may require some pushing, and you may have mild contractions. Breastfeeding can stimulate contractions to help you deliver the placenta.  After the placenta is delivered, your uterus should tighten (contract) and become firm. This helps to stop bleeding in your uterus. To help your uterus contract and to control bleeding, your health care provider may: ? Give you medicine by injection, through an IV tube, by mouth, or through your rectum (rectally). ? Massage your abdomen or perform a vaginal exam to remove any blood clots that are left in your uterus. ? Empty your bladder by placing a thin, flexible tube (catheter) into your bladder. ? Encourage   you to breastfeed your baby. After labor is over, you and your baby will be monitored closely to ensure that you are both healthy until you are ready to go home. Your health care team will teach you how to care for yourself and your baby. This information is not intended to replace advice given to you by your health care provider. Make sure you discuss any questions you have with your health care provider. Document Released: 12/22/2007 Document Revised: 10/02/2015 Document Reviewed: 03/29/2015 Elsevier Interactive Patient Education  2018 [employer]  

## 2017-10-04 NOTE — Progress Notes (Signed)
ROB GBS/Aptima 

## 2017-10-06 LAB — CERVICOVAGINAL ANCILLARY ONLY
CHLAMYDIA, DNA PROBE: NEGATIVE
NEISSERIA GONORRHEA: NEGATIVE

## 2017-10-06 LAB — STREP GP B NAA: STREP GROUP B AG: NEGATIVE

## 2017-10-11 ENCOUNTER — Ambulatory Visit (INDEPENDENT_AMBULATORY_CARE_PROVIDER_SITE_OTHER): Payer: Medicaid Other | Admitting: Maternal Newborn

## 2017-10-11 ENCOUNTER — Encounter: Payer: Self-pay | Admitting: Maternal Newborn

## 2017-10-11 VITALS — BP 110/70 | Wt 150.0 lb

## 2017-10-11 DIAGNOSIS — Z3A37 37 weeks gestation of pregnancy: Secondary | ICD-10-CM

## 2017-10-11 DIAGNOSIS — O0990 Supervision of high risk pregnancy, unspecified, unspecified trimester: Secondary | ICD-10-CM

## 2017-10-11 NOTE — Patient Instructions (Signed)
Vaginal Delivery Vaginal delivery means that you will give birth by pushing your baby out of your birth canal (vagina). A team of health care providers will help you before, during, and after vaginal delivery. Birth experiences are unique for every woman and every pregnancy, and birth experiences vary depending on where you choose to give birth. What should I do to prepare for my baby's birth? Before your baby is born, it is important to talk with your health care provider about:  Your labor and delivery preferences. These may include: ? Medicines that you may be given. ? How you will manage your pain. This might include non-medical pain relief techniques or injectable pain relief such as epidural analgesia. ? How you and your baby will be monitored during labor and delivery. ? Who may be in the labor and delivery room with you. ? Your feelings about surgical delivery of your baby (cesarean delivery, or C-section) if this becomes necessary. ? Your feelings about receiving donated blood through an IV tube (blood transfusion) if this becomes necessary.  Whether you are able: ? To take pictures or videos of the birth. ? To eat during labor and delivery. ? To move around, walk, or change positions during labor and delivery.  What to expect after your baby is born, such as: ? Whether delayed umbilical cord clamping and cutting is offered. ? Who will care for your baby right after birth. ? Medicines or tests that may be recommended for your baby. ? Whether breastfeeding is supported in your hospital or birth center. ? How long you will be in the hospital or birth center.  How any medical conditions you have may affect your baby or your labor and delivery experience.  To prepare for your baby's birth, you should also:  Attend all of your health care visits before delivery (prenatal visits) as recommended by your health care provider. This is important.  Prepare your home for your baby's  arrival. Make sure that you have: ? Diapers. ? Baby clothing. ? Feeding equipment. ? Safe sleeping arrangements for you and your baby.  Install a car seat in your vehicle. Have your car seat checked by a certified car seat installer to make sure that it is installed safely.  Think about who will help you with your new baby at home for at least the first several weeks after delivery.  What can I expect when I arrive at the birth center or hospital? Once you are in labor and have been admitted into the hospital or birth center, your health care provider may:  Review your pregnancy history and any concerns you have.  Insert an IV tube into one of your veins. This is used to give you fluids and medicines.  Check your blood pressure, pulse, temperature, and heart rate (vital signs).  Check whether your bag of water (amniotic sac) has broken (ruptured).  Talk with you about your birth plan and discuss pain control options.  Monitoring Your health care provider may monitor your contractions (uterine monitoring) and your baby's heart rate (fetal monitoring). You may need to be monitored:  Often, but not continuously (intermittently).  All the time or for long periods at a time (continuously). Continuous monitoring may be needed if: ? You are taking certain medicines, such as medicine to relieve pain or make your contractions stronger. ? You have pregnancy or labor complications.  Monitoring may be done by:  Placing a special stethoscope or a handheld monitoring device on your abdomen to   check your baby's heartbeat, and feeling your abdomen for contractions. This method of monitoring does not continuously record your baby's heartbeat or your contractions.  Placing monitors on your abdomen (external monitors) to record your baby's heartbeat and the frequency and length of contractions. You may not have to wear external monitors all the time.  Placing monitors inside of your uterus  (internal monitors) to record your baby's heartbeat and the frequency, length, and strength of your contractions. ? Your health care provider may use internal monitors if he or she needs more information about the strength of your contractions or your baby's heart rate. ? Internal monitors are put in place by passing a thin, flexible wire through your vagina and into your uterus. Depending on the type of monitor, it may remain in your uterus or on your baby's head until birth. ? Your health care provider will discuss the benefits and risks of internal monitoring with you and will ask for your permission before inserting the monitors.  Telemetry. This is a type of continuous monitoring that can be done with external or internal monitors. Instead of having to stay in bed, you are able to move around during telemetry. Ask your health care provider if telemetry is an option for you.  Physical exam Your health care provider may perform a physical exam. This may include:  Checking whether your baby is positioned: ? With the head toward your vagina (head-down). This is most common. ? With the head toward the top of your uterus (head-up or breech). If your baby is in a breech position, your health care provider may try to turn your baby to a head-down position so you can deliver vaginally. If it does not seem that your baby can be born vaginally, your provider may recommend surgery to deliver your baby. In rare cases, you may be able to deliver vaginally if your baby is head-up (breech delivery). ? Lying sideways (transverse). Babies that are lying sideways cannot be delivered vaginally.  Checking your cervix to determine: ? Whether it is thinning out (effacing). ? Whether it is opening up (dilating). ? How low your baby has moved into your birth canal.  What are the three stages of labor and delivery?  Normal labor and delivery is divided into the following three stages: Stage 1  Stage 1 is the  longest stage of labor, and it can last for hours or days. Stage 1 includes: ? Early labor. This is when contractions may be irregular, or regular and mild. Generally, early labor contractions are more than 10 minutes apart. ? Active labor. This is when contractions get longer, more regular, more frequent, and more intense. ? The transition phase. This is when contractions happen very close together, are very intense, and may last longer than during any other part of labor.  Contractions generally feel mild, infrequent, and irregular at first. They get stronger, more frequent (about every 2-3 minutes), and more regular as you progress from early labor through active labor and transition.  Many women progress through stage 1 naturally, but you may need help to continue making progress. If this happens, your health care provider may talk with you about: ? Rupturing your amniotic sac if it has not ruptured yet. ? Giving you medicine to help make your contractions stronger and more frequent.  Stage 1 ends when your cervix is completely dilated to 4 inches (10 cm) and completely effaced. This happens at the end of the transition phase. Stage 2  Once   your cervix is completely effaced and dilated to 4 inches (10 cm), you may start to feel an urge to push. It is common for the body to naturally take a rest before feeling the urge to push, especially if you received an epidural or certain other pain medicines. This rest period may last for up to 1-2 hours, depending on your unique labor experience.  During stage 2, contractions are generally less painful, because pushing helps relieve contraction pain. Instead of contraction pain, you may feel stretching and burning pain, especially when the widest part of your baby's head passes through the vaginal opening (crowning).  Your health care provider will closely monitor your pushing progress and your baby's progress through the vagina during stage 2.  Your  health care provider may massage the area of skin between your vaginal opening and anus (perineum) or apply warm compresses to your perineum. This helps it stretch as the baby's head starts to crown, which can help prevent perineal tearing. ? In some cases, an incision may be made in your perineum (episiotomy) to allow the baby to pass through the vaginal opening. An episiotomy helps to make the opening of the vagina larger to allow more room for the baby to fit through.  It is very important to breathe and focus so your health care provider can control the delivery of your baby's head. Your health care provider may have you decrease the intensity of your pushing, to help prevent perineal tearing.  After delivery of your baby's head, the shoulders and the rest of the body generally deliver very quickly and without difficulty.  Once your baby is delivered, the umbilical cord may be cut right away, or this may be delayed for 1-2 minutes, depending on your baby's health. This may vary among health care providers, hospitals, and birth centers.  If you and your baby are healthy enough, your baby may be placed on your chest or abdomen to help maintain the baby's temperature and to help you bond with each other. Some mothers and babies start breastfeeding at this time. Your health care team will dry your baby and help keep your baby warm during this time.  Your baby may need immediate care if he or she: ? Showed signs of distress during labor. ? Has a medical condition. ? Was born too early (prematurely). ? Had a bowel movement before birth (meconium). ? Shows signs of difficulty transitioning from being inside the uterus to being outside of the uterus. If you are planning to breastfeed, your health care team will help you begin a feeding. Stage 3  The third stage of labor starts immediately after the birth of your baby and ends after you deliver the placenta. The placenta is an organ that develops  during pregnancy to provide oxygen and nutrients to your baby in the womb.  Delivering the placenta may require some pushing, and you may have mild contractions. Breastfeeding can stimulate contractions to help you deliver the placenta.  After the placenta is delivered, your uterus should tighten (contract) and become firm. This helps to stop bleeding in your uterus. To help your uterus contract and to control bleeding, your health care provider may: ? Give you medicine by injection, through an IV tube, by mouth, or through your rectum (rectally). ? Massage your abdomen or perform a vaginal exam to remove any blood clots that are left in your uterus. ? Empty your bladder by placing a thin, flexible tube (catheter) into your bladder. ? Encourage   you to breastfeed your baby. After labor is over, you and your baby will be monitored closely to ensure that you are both healthy until you are ready to go home. Your health care team will teach you how to care for yourself and your baby. This information is not intended to replace advice given to you by your health care provider. Make sure you discuss any questions you have with your health care provider. Document Released: 12/22/2007 Document Revised: 10/02/2015 Document Reviewed: 03/29/2015 Elsevier Interactive Patient Education  2018 [employer]  

## 2017-10-11 NOTE — Progress Notes (Signed)
Routine Prenatal Care Visit  Subjective  Amy [name] is a 22 y.o. G1P0 at 8932w2d being seen today for ongoing prenatal care.  She is currently monitored for the following issues for this high-risk pregnancy and has Severe recurrent major depression without psychotic features (HCC); Eating disorder; Generalized anxiety disorder; Supervision of high risk pregnancy, antepartum; and Late prenatal care on their problem list.  ----------------------------------------------------------------------------------- Patient reports no complaints.   Contractions: Not present. Vag. Bleeding: None.  Movement: Present. No leaking of fluid.  ----------------------------------------------------------------------------------- The following portions of the patient's history were reviewed and updated as appropriate: allergies, current medications, past family history, past medical history, past social history, past surgical history and problem list. Problem list updated.   Objective  Blood pressure 110/70, weight 150 lb (68 kg), last menstrual period 02/14/2017. Pregravid weight 115 lb (52.2 kg) Total Weight Gain 35 lb (15.9 kg) Body mass index is 23.49 kg/m. Urinalysis:      Fetal Status: Fetal Heart Rate (bpm): 135 Fundal Height: 35 cm Movement: Present     General:  Alert, oriented and cooperative. Patient is in no acute distress.  Skin: Skin is warm and dry. No rash noted.   Cardiovascular: Normal heart rate noted  Respiratory: Normal respiratory effort, no problems with respiration noted  Abdomen: Soft, gravid, appropriate for gestational age. Pain/Pressure: Absent     Pelvic:  Cervical exam deferred        Extremities: Normal range of motion.  Edema: None  Mental Status: Normal mood and affect. Normal behavior. Normal judgment and thought content.     Assessment   22 y.o. G1P0 at 2632w2d, EDD 10/30/2017 by Ultrasound presenting for routine prenatal visit.  Plan   pregnancy Problems  (from 06/28/17 to present)    Problem Noted Resolved   Supervision of high risk pregnancy, antepartum 07/26/2017 by Farrel ConnersGutierrez, Colleen, CNM No   Overview Addendum 09/11/2017 12:02 PM by Natale MilchSchuman, Christanna R, MD     [facility]inic [facility] Prenatal Labs  Dating 26wk2d ultrasound Blood type: O/Positive/-- (04/03 1507)   Genetic Screen 1 Screen:    AFP:     Quad:     NIPS: Antibody:Negative (04/03 1507)  Anatomic US complete Rubella: 1.25 (04/03 1507) Varicella Immune  GTT Early:               Third trimester: 129 RPR: Non Reactive (04/03 1507)   Flu vaccine  HBsAg: Negative (04/03 1507)   TDaP vaccine 09/11/17                                               Rhogam: not needed HIV: Non Reactive (04/03 1507)   Baby Food  Breast                                             GBS: (For PCN allergy, check sensitivities)  Contraception  undecided, given information Pap:  Circumcision  +MJ on UDS 4/3  Pediatrician    Support Person                Term labor symptoms and general obstetric precautions were reviewed.  Please refer to After Visit Summary for other counseling recommendations.   Return in about 1 week (around 10/18/2017) for  [name], CNM 10/11/2017  8:25 AM

## 2017-10-11 NOTE — Progress Notes (Signed)
No [email or web address] 

## 2017-10-18 ENCOUNTER — Ambulatory Visit (INDEPENDENT_AMBULATORY_CARE_PROVIDER_SITE_OTHER): Payer: Medicaid Other | Admitting: Advanced Practice Midwife

## 2017-10-18 ENCOUNTER — Encounter: Payer: Self-pay | Admitting: Advanced Practice Midwife

## 2017-10-18 VITALS — BP 102/60 | Wt 151.0 lb

## 2017-10-18 DIAGNOSIS — Z3A38 38 weeks gestation of pregnancy: Secondary | ICD-10-CM

## 2017-10-18 NOTE — Progress Notes (Signed)
Routine Prenatal Care Visit  Subjective  Amy [name] is a 22 y.o. G1P0 at 6831w2d being seen today for ongoing prenatal care.  She is currently monitored for the following issues for this high-risk pregnancy and has Severe recurrent major depression without psychotic features (HCC); Eating disorder; Generalized anxiety disorder; Supervision of high risk pregnancy, antepartum; and Late prenatal care on their problem list.  ----------------------------------------------------------------------------------- Patient reports no complaints.   Contractions: Not present. Vag. Bleeding: None.  Movement: Present. Denies leaking of fluid.  ----------------------------------------------------------------------------------- The following portions of the patient's history were reviewed and updated as appropriate: allergies, current medications, past family history, past medical history, past social history, past surgical history and problem list. Problem list updated.   Objective  Blood pressure 102/60, weight 151 lb (68.5 kg), last menstrual period 02/14/2017. Pregravid weight 115 lb (52.2 kg) Total Weight Gain 36 lb (16.3 kg) Urinalysis:      Fetal Status: Fetal Heart Rate (bpm): 130 Fundal Height: 37 cm Movement: Present     General:  Alert, oriented and cooperative. Patient is in no acute distress.  Skin: Skin is warm and dry. No rash noted.   Cardiovascular: Normal heart rate noted  Respiratory: Normal respiratory effort, no problems with respiration noted  Abdomen: Soft, gravid, appropriate for gestational age. Pain/Pressure: Absent     Pelvic:  Cervical exam deferred        Extremities: Normal range of motion.  Edema: None  Mental Status: Normal mood and affect. Normal behavior. Normal judgment and thought content.   Assessment   22 y.o. G1P0 at 2131w2d by  10/30/2017, by Ultrasound presenting for routine prenatal visit  Plan   pregnancy Problems (from 06/28/17 to present)    Problem Noted Resolved   Supervision of high risk pregnancy, antepartum 07/26/2017 by Farrel ConnersGutierrez, Colleen, CNM No   Overview Addendum 09/11/2017 12:02 PM by Natale MilchSchuman, Christanna R, MD     [facility] Prenatal Labs  Dating 26wk2d ultrasound Blood type: O/Positive/-- (04/03 1507)   Genetic Screen 1 Screen:    AFP:     Quad:     NIPS: Antibody:Negative (04/03 1507)  Anatomic US complete Rubella: 1.25 (04/03 1507) Varicella Immune  GTT Early:               Third trimester: 129 RPR: Non Reactive (04/03 1507)   Flu vaccine  HBsAg: Negative (04/03 1507)   TDaP vaccine 09/11/17                                               Rhogam: not needed HIV: Non Reactive (04/03 1507)   Baby Food  Breast                                             GBS: (For PCN allergy, check sensitivities)  Contraception  undecided, given information Pap:  Circumcision  +MJ on UDS 4/3  Pediatrician    Support Person Mom Candy, FOB                Term labor symptoms and general obstetric precautions including but not limited to vaginal bleeding, contractions, leaking of fluid and fetal movement were reviewed in detail with the patient.   Return in about 1 week (around 10/25/2017)  for rob.  Tresea Mall, CNM 10/18/2017 8:32 AM

## 2017-10-24 ENCOUNTER — Encounter: Payer: Self-pay | Admitting: Family Medicine

## 2017-10-24 ENCOUNTER — Ambulatory Visit (INDEPENDENT_AMBULATORY_CARE_PROVIDER_SITE_OTHER): Payer: Medicaid Other | Admitting: Family Medicine

## 2017-10-24 VITALS — BP 102/68 | HR 100 | Temp 98.1°F | Resp 16 | Ht 66.0 in | Wt 149.9 lb

## 2017-10-24 DIAGNOSIS — F332 Major depressive disorder, recurrent severe without psychotic features: Secondary | ICD-10-CM

## 2017-10-24 DIAGNOSIS — F509 Eating disorder, unspecified: Secondary | ICD-10-CM | POA: Diagnosis not present

## 2017-10-24 DIAGNOSIS — F411 Generalized anxiety disorder: Secondary | ICD-10-CM

## 2017-10-24 DIAGNOSIS — R21 Rash and other nonspecific skin eruption: Secondary | ICD-10-CM

## 2017-10-24 DIAGNOSIS — Z7689 Persons encountering health services in other specified circumstances: Secondary | ICD-10-CM

## 2017-10-24 NOTE — Progress Notes (Signed)
Name: Amy [name]   MRN: [id number]    DOB: [birth year]   Date:10/24/2017       Progress Note  Subjective  Chief Complaint  Chief Complaint  Patient presents with  . Establish Care  . Rash    on legs    HPI  Pt presents to establish care with our practice; she used to see [facility], but needs to establish with a PCP who cares for adults.  She is [redacted] weeks pregnant with a baby boy.  Her father also joins her during today's visit.   Rash: RIGHT leg and right arm - she notes several small red raised bumps that are itchy; has been applying hydrocortisone cream which has helped her symptoms.  Denies fevers/chills, chest pain, recent tick bites, international travel or travel to zika risk areas.  Depression: Has history of depression, she has not taken any medications in many years; says she feels like she is in a healthier environment now (out of school, no longer being bullied).  She dropped out of high school as a senior because her depression was so severe; history of hospitalization for this and eating disorder. She is not working currently - lives with Dad and boyfriend. Does have a partner - female partner - feels safe with him.  PHQ-9 today is 0.  History of eating disorder: Occurred after a friend passed away she was restricting her eating and was hospitalized for this.  She was not over-exercising.   Patient Active Problem List   Diagnosis Date Noted  . Supervision of high risk pregnancy, antepartum 07/26/2017  . Late prenatal care 07/26/2017  . 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 Eating disorder 08/21/2014  . Generalized anxiety disorder 08/21/2014    History reviewed. No pertinent surgical history.  Family History  Adopted: Yes  Problem Relation Age of Onset  . Breast cancer Maternal Grandmother   . Diabetes Mellitus II Maternal Grandmother   . Depression Mother   . Depression Sister   . Depression Brother    . Anxiety disorder Brother   . Premature birth Sister   . Depression Sister     Social History   Socioeconomic History  . Marital status: Single    Spouse name: Not on file  . Number of children: Not on file  . Years of education: Not on file  . Highest education level: 11th grade  Occupational History  . Not on file  Social Needs  . Financial resource strain: Not hard at all  . Food insecurity:    Worry: Never true    Inability: Never true  . Transportation needs:    Medical: No    Non-medical: No  Tobacco Use  . Smoking status: Former Smoker    Start date: 03/26/2017  . Smokeless tobacco: Never Used  Substance and Sexual Activity  . Alcohol use: No  . Drug use: Not Currently    Types: Marijuana    Comment: had +MJ screen at [facility]  . Sexual activity: Yes    Birth control/protection: None  Lifestyle  . Physical activity:    Days per week: 7 days    Minutes per session: 20 min  . Stress: Only a little  Relationships  . Social connections:    Talks on phone: More than three times a week    Gets together: More than three times a week    Attends religious service: Never    Active member of club  or organization: No    Attends meetings of clubs or organizations: Never    Relationship status: Never married  . Intimate partner violence:    Fear of current or ex partner: No    Emotionally abused: No    Physically abused: No    Forced sexual activity: No  Other Topics Concern  . Not on file  Social History Narrative   Lives with adoptive father and her boyfriend; baby boy is due 10/30/2017      Mom and older sister live in [place] and she does have periodic contact with them.    Current Outpatient Medications:  .  Prenatal Vit-Fe Fumarate-FA (PRENATAL MULTIVITAMIN) TABS tablet, Take 1 tablet by mouth daily at 12 noon., Disp: , Rfl:  .  medroxyPROGESTERone (DEPO-PROVERA) 150 MG/ML injection, Inject 150 mg into the muscle every 3 (three) months., Disp: , Rfl:   No  Known Allergies  ROS Constitutional: Negative for fever or weight change.  Respiratory: Negative for cough and shortness of breath.   Cardiovascular: Negative for chest pain or palpitations.  Gastrointestinal: Negative for abdominal pain, no bowel changes.  Musculoskeletal: Negative for gait problem or joint swelling.  Skin: Positive for rash.  Neurological: Negative for dizziness or headache.  No other specific complaints in a complete review of systems (except as listed in HPI above).  Objective  Vitals:   10/24/17 1053  BP: 102/68  Pulse: 100  Resp: 16  Temp: 98.1 F (36.7 C)  TempSrc: Oral  SpO2: 97%  Weight: 149 lb 14.4 oz (68 kg)  Height: 5\' 6"  (1.676 m)   Body mass index is 24.19 kg/m.  Physical Exam  Constitutional: She is oriented to person, place, and time. She appears well-developed and well-nourished.  HENT:  Head: Normocephalic and atraumatic.  Right Ear: Tympanic membrane normal.  Left Ear: Tympanic membrane normal.  Nose: Nose normal.  Mouth/Throat: Uvula is midline, oropharynx is clear and moist and mucous membranes are normal. No oropharyngeal exudate. No tonsillar exudate.  Eyes: Pupils are equal, round, and reactive to light. Conjunctivae and EOM are normal. No scleral icterus.  Neck: Normal range of motion. Neck supple.  Cardiovascular: Normal rate, regular rhythm and normal heart sounds.  Pulmonary/Chest: Effort normal and breath sounds normal. No respiratory distress. She has no wheezes. She has no rales. She exhibits no tenderness.  Abdominal: Bowel sounds are normal. There is no tenderness.  Gravid  Musculoskeletal: Normal range of motion. She exhibits no edema.  Lymphadenopathy:    She has no cervical adenopathy.  Neurological: She is alert and oriented to person, place, and time. No cranial nerve deficit.  Skin: Skin is warm and dry. No rash noted. No erythema.  Multiple 1-2cm diameter raised eythematous papules present on right thigh and  right forearm consistent with mosquito bites.  No signs of secondary infection or excoriation.  Psychiatric: She has a normal mood and affect. Her behavior is normal. Judgment and thought content normal.  Nursing note and vitals reviewed.  No results found for this or any previous visit (from the past 72 hour(s)).  PHQ2/9: Depression screen [employer] 2/9 10/24/2017 10/24/2017  Decreased Interest 0 0  Down, Depressed, Hopeless 0 0  PHQ - 2 Score 0 0  Altered sleeping 0 -  Tired, decreased energy 0 -  Change in appetite 0 -  Feeling bad or failure about yourself  0 -  Trouble concentrating 0 -  Moving slowly or fidgety/restless 0 -  Suicidal thoughts 0 -  PHQ-9  Score 0 -  Difficult doing work/chores Not difficult at all -   Fall Risk: Fall Risk  10/24/2017  Falls in the past year? No   Assessment & Plan  1. Eating disorder, unspecified type 2. Generalized anxiety disorder 3. Severe recurrent major depression without psychotic features (HCC) - Discussed factors related to her previous episodes of depression, anxiety, and eating disorder at length.  She feels good about her life at this time, is looking forward to the birth of her son, her Dad and boyfriend are supportive; does have contact with mom and sisters as well.  I advised she closely monitor her symptoms in the postpartum period as postpartum depression can occur after delivery - I recommend she contact her OB/GYN or myself if she is feeling like her mood is low or if her anxiety is elevated.  She verbalizes understanding and agreement.  4. Rash - Advised to avoid hydrocortisone cream for the time being as it is not recommended in pregnancy.  5. Encounter to establish care - Pt is due with her first pregnancy in 6 days, so no additional health maintenance to be performed today; will schedule a CPE for 6 months to allow her time to adjust

## 2017-10-25 ENCOUNTER — Ambulatory Visit (INDEPENDENT_AMBULATORY_CARE_PROVIDER_SITE_OTHER): Payer: Medicaid Other | Admitting: Advanced Practice Midwife

## 2017-10-25 ENCOUNTER — Encounter: Payer: Self-pay | Admitting: Advanced Practice Midwife

## 2017-10-25 VITALS — BP 112/60 | Wt 151.0 lb

## 2017-10-25 DIAGNOSIS — Z3A39 39 weeks gestation of pregnancy: Secondary | ICD-10-CM

## 2017-10-25 NOTE — Progress Notes (Signed)
Routine Prenatal Care Visit  Subjective  Amy [name] is a 22 y.o. G1P0 at [redacted]w[redacted]d being seen today for ongoing prenatal care.  She is currently monitored for the following issues for this high-risk pregnancy and has Severe recurrent major depression without psychotic features (HCC); Eating disorder; Generalized anxiety disorder; Supervision of high risk pregnancy, antepartum; and Late prenatal care on their problem list.  ----------------------------------------------------------------------------------- Patient reports no complaints.   Contractions: Not present. Vag. Bleeding: None.  Movement: Present. Denies leaking of fluid.  ----------------------------------------------------------------------------------- The following portions of the patient's history were reviewed and updated as appropriate: allergies, current medications, past family history, past medical history, past social history, past surgical history and problem list. Problem list updated.   Objective  Blood pressure 112/60, weight 151 lb (68.5 kg), last menstrual period 02/14/2017. Pregravid weight 115 lb (52.2 kg) Total Weight Gain 36 lb (16.3 kg) Urinalysis: Urine Protein: Trace Urine Glucose: Negative  Fetal Status: Fetal Heart Rate (bpm): 128 Fundal Height: 38 cm Movement: Present  Presentation: Vertex  General:  Alert, oriented and cooperative. Patient is in no acute distress.  Skin: Skin is warm and dry. No rash noted.   Cardiovascular: Normal heart rate noted  Respiratory: Normal respiratory effort, no problems with respiration noted  Abdomen: Soft, gravid, appropriate for gestational age. Pain/Pressure: Present     Pelvic:  Cervical exam performed   Effacement (%): 60 Station: -1 Cervix is posterior and feels thinned to 60% but unable to reach OS  Extremities: Normal range of motion.  Edema: None  Mental Status: Normal mood and affect. Normal behavior. Normal judgment and thought content.   Assessment    22 y.o. G1P0 at [redacted]w[redacted]d by  10/30/2017, by Ultrasound presenting for routine prenatal visit  Plan   pregnancy Problems (from 06/28/17 to present)    Problem Noted Resolved   Supervision of high risk pregnancy, antepartum 07/26/2017 by Farrel Conners, CNM No   Overview Addendum 09/11/2017 12:02 PM by Natale Milch, MD     [facility] Prenatal Labs  Dating 26wk2d ultrasound Blood type: O/Positive/-- (04/03 1507)   Genetic Screen 1 Screen:    AFP:     Quad:     NIPS: Antibody:Negative (04/03 1507)  Anatomic [place] complete Rubella: 1.25 (04/03 1507) Varicella Immune  GTT Early:               Third trimester: 129 RPR: Non Reactive (04/03 1507)   Flu vaccine  HBsAg: Negative (04/03 1507)   TDaP vaccine 09/11/17                                               Rhogam: not needed HIV: Non Reactive (04/03 1507)   Baby Food  Breast                                             GBS: (For PCN allergy, check sensitivities)  Contraception  undecided, given information Pap:  Circumcision  +MJ on UDS 4/3  Pediatrician    Support Person                 Term labor symptoms and general obstetric precautions including but not limited to vaginal bleeding, contractions, leaking of fluid and fetal movement were  reviewed in detail with the patient. Please refer to After Visit Summary for other counseling recommendations.   Return in about 1 week (around 11/01/2017) for rob.  Tresea MallJane Marc Leichter, CNM 10/25/2017 8:28 AM

## 2017-10-25 NOTE — Addendum Note (Signed)
Addended by: Garfield CorneaMABRY, Tache Bobst L on: 10/25/2017 09:46 AM   Modules accepted: Kipp BroodSmartSet

## 2017-11-01 ENCOUNTER — Ambulatory Visit (INDEPENDENT_AMBULATORY_CARE_PROVIDER_SITE_OTHER): Payer: Medicaid Other | Admitting: Advanced Practice Midwife

## 2017-11-01 ENCOUNTER — Telehealth: Payer: Self-pay | Admitting: Advanced Practice Midwife

## 2017-11-01 ENCOUNTER — Encounter: Payer: Self-pay | Admitting: Advanced Practice Midwife

## 2017-11-01 VITALS — BP 120/70 | Wt 153.0 lb

## 2017-11-01 DIAGNOSIS — Z3A40 40 weeks gestation of pregnancy: Secondary | ICD-10-CM

## 2017-11-01 DIAGNOSIS — O0990 Supervision of high risk pregnancy, unspecified, unspecified trimester: Secondary | ICD-10-CM

## 2017-11-01 NOTE — Telephone Encounter (Signed)
Pt needs nst/rob on Monday since she cannot have her IOL until 8/15. VM is full and grandfather didn't take message for her. She is active on mychart so I added her Monday per Jane's request so hopefully that will alert her. Will try calling her back later as well to confirm.

## 2017-11-01 NOTE — Progress Notes (Signed)
Routine Prenatal Care Visit  Subjective  Margaurite Salido is a 22 y.o. G1P0 at [redacted]w[redacted]d being seen today for ongoing prenatal care.  She is currently monitored for the following issues for this high-risk pregnancy and has Severe recurrent major depression without psychotic features (HCC); Eating disorder; Generalized anxiety disorder; Supervision of high risk pregnancy, antepartum; and Late prenatal care on their problem list.  ----------------------------------------------------------------------------------- Patient reports no complaints.   Contractions: Irritability. Vag. Bleeding: None.  Movement: Present. Denies leaking of fluid.  ----------------------------------------------------------------------------------- The following portions of the patient's history were reviewed and updated as appropriate: allergies, current medications, past family history, past medical history, past social history, past surgical history and problem list. Problem list updated.   Objective  Blood pressure 120/70, weight 153 lb (69.4 kg), last menstrual period 02/14/2017. Pregravid weight 115 lb (52.2 kg) Total Weight Gain 38 lb (17.2 kg) Urinalysis:      Fetal Status: Fetal Heart Rate (bpm): 129 Fundal Height: 38 cm Movement: Present  Presentation: Vertex  General:  Alert, oriented and cooperative. Patient is in no acute distress.  Skin: Skin is warm and dry. No rash noted.   Cardiovascular: Normal heart rate noted  Respiratory: Normal respiratory effort, no problems with respiration noted  Abdomen: Soft, gravid, appropriate for gestational age. Pain/Pressure: Present     Pelvic:  Cervical exam performed   Effacement (%): 60 Station: -1 cervix still posterior and unable to assess dilation  Extremities: Normal range of motion.     Mental Status: Normal mood and affect. Normal behavior. Normal judgment and thought content.   Assessment   22 y.o. G1P0 at [redacted]w[redacted]d by  10/30/2017, by Ultrasound presenting  for routine prenatal visit  Plan   pregnancy Problems (from 06/28/17 to present)    Problem Noted Resolved   Supervision of high risk pregnancy, antepartum 07/26/2017 by Farrel Conners, CNM No   Overview Addendum 09/11/2017 12:02 PM by Natale Milch, MD     [facility] Prenatal Labs  Dating 26wk2d ultrasound Blood type: O/Positive/-- (04/03 1507)   Genetic Screen 1 Screen:    AFP:     Quad:     NIPS: Antibody:Negative (04/03 1507)  Anatomic [place] complete Rubella: 1.25 (04/03 1507) Varicella Immune  GTT Early:               Third trimester: 129 RPR: Non Reactive (04/03 1507)   Flu vaccine  HBsAg: Negative (04/03 1507)   TDaP vaccine 09/11/17                                               Rhogam: not needed HIV: Non Reactive (04/03 1507)   Baby Food  Breast                                             GBS: (For PCN allergy, check sensitivities)  Contraception  undecided, given information Pap:  Circumcision  +MJ on UDS 4/3  Pediatrician    Support Person                 Term labor symptoms and general obstetric precautions including but not limited to vaginal bleeding, contractions, leaking of fluid and fetal movement were reviewed in detail with the patient.  Return in about 1 week (around 11/08/2017) for nst/rob.  Tresea MallJane Cylie Dor, CNM 11/01/2017 12:10 PM

## 2017-11-02 ENCOUNTER — Inpatient Hospital Stay: Payer: Medicaid Other | Admitting: Anesthesiology

## 2017-11-02 ENCOUNTER — Inpatient Hospital Stay
Admission: EM | Admit: 2017-11-02 | Discharge: 2017-11-04 | DRG: 806 | Disposition: A | Discharge status: Home / Self Care | Payer: Medicaid Other | Attending: Obstetrics and Gynecology | Admitting: Obstetrics and Gynecology

## 2017-11-02 ENCOUNTER — Other Ambulatory Visit: Payer: Self-pay

## 2017-11-02 DIAGNOSIS — Z87891 Personal history of nicotine dependence: Secondary | ICD-10-CM | POA: Diagnosis not present

## 2017-11-02 DIAGNOSIS — O9081 Anemia of the puerperium: Secondary | ICD-10-CM | POA: Diagnosis not present

## 2017-11-02 DIAGNOSIS — F411 Generalized anxiety disorder: Secondary | ICD-10-CM | POA: Diagnosis present

## 2017-11-02 DIAGNOSIS — Z3A40 40 weeks gestation of pregnancy: Secondary | ICD-10-CM

## 2017-11-02 DIAGNOSIS — Z3483 Encounter for supervision of other normal pregnancy, third trimester: Secondary | ICD-10-CM | POA: Diagnosis present

## 2017-11-02 DIAGNOSIS — D62 Acute posthemorrhagic anemia: Secondary | ICD-10-CM | POA: Diagnosis not present

## 2017-11-02 DIAGNOSIS — O0990 Supervision of high risk pregnancy, unspecified, unspecified trimester: Secondary | ICD-10-CM

## 2017-11-02 DIAGNOSIS — F332 Major depressive disorder, recurrent severe without psychotic features: Secondary | ICD-10-CM | POA: Diagnosis present

## 2017-11-02 LAB — CBC
HEMATOCRIT: 33.2 % — AB (ref 35.0–47.0)
Hemoglobin: 11.4 g/dL — ABNORMAL LOW (ref 12.0–16.0)
MCH: 27.4 pg (ref 26.0–34.0)
MCHC: 34.3 g/dL (ref 32.0–36.0)
MCV: 79.7 fL — AB (ref 80.0–100.0)
Platelets: 266 10*3/uL (ref 150–440)
RBC: 4.17 MIL/uL (ref 3.80–5.20)
RDW: 15 % — AB (ref 11.5–14.5)
WBC: 8.9 10*3/uL (ref 3.6–11.0)

## 2017-11-02 LAB — TYPE AND SCREEN
ABO/RH(D): O POS
Antibody Screen: NEGATIVE

## 2017-11-02 MED ORDER — SODIUM CHLORIDE 0.9 % IV SOLN
INTRAVENOUS | Status: DC | PRN
  Administered 2017-11-02 (×3): 5 mL via EPIDURAL

## 2017-11-02 MED ORDER — SIMETHICONE 80 MG PO CHEW: 80.0000 mg | CHEWABLE_TABLET | ORAL | Status: DC | PRN

## 2017-11-02 MED ORDER — MISOPROSTOL 200 MCG PO TABS
ORAL_TABLET | ORAL | Status: AC
  Filled 2017-11-02: qty 4

## 2017-11-02 MED ORDER — LIDOCAINE HCL (PF) 1 % IJ SOLN
INTRAMUSCULAR | Status: AC
  Filled 2017-11-02: qty 30

## 2017-11-02 MED ORDER — COCONUT OIL OIL
TOPICAL_OIL | Status: AC
  Administered 2017-11-02: 1 via TOPICAL
  Filled 2017-11-02: qty 120

## 2017-11-02 MED ORDER — OXYTOCIN 40 UNITS IN LACTATED RINGERS INFUSION - SIMPLE MED
1.0000 m[IU]/min | INTRAVENOUS | Status: DC
  Administered 2017-11-02: 2 m[IU]/min via INTRAVENOUS

## 2017-11-02 MED ORDER — ACETAMINOPHEN 325 MG PO TABS: 650.0000 mg | ORAL_TABLET | ORAL | Status: DC | PRN

## 2017-11-02 MED ORDER — LACTATED RINGERS IV SOLN: 500.0000 mL | INTRAVENOUS | Status: DC | PRN

## 2017-11-02 MED ORDER — AMMONIA AROMATIC IN INHA
RESPIRATORY_TRACT | Status: AC
  Filled 2017-11-02: qty 10

## 2017-11-02 MED ORDER — FENTANYL 2.5 MCG/ML W/ROPIVACAINE 0.15% IN NS 100 ML EPIDURAL (ARMC)
12.0000 mL/h | EPIDURAL | Status: DC
  Administered 2017-11-02: 12 mL/h via EPIDURAL

## 2017-11-02 MED ORDER — DIPHENHYDRAMINE HCL 50 MG/ML IJ SOLN: 12.5000 mg | INTRAMUSCULAR | Status: DC | PRN

## 2017-11-02 MED ORDER — LIDOCAINE-EPINEPHRINE (PF) 1.5 %-1:200000 IJ SOLN
INTRAMUSCULAR | Status: DC | PRN
  Administered 2017-11-02: 3 mL

## 2017-11-02 MED ORDER — IBUPROFEN 600 MG PO TABS
600.0000 mg | ORAL_TABLET | Freq: Four times a day (QID) | ORAL | Status: DC
  Administered 2017-11-03 – 2017-11-04 (×3): 600 mg via ORAL
  Filled 2017-11-02 (×6): qty 1

## 2017-11-02 MED ORDER — LIDOCAINE HCL (PF) 1 % IJ SOLN: 30.0000 mL | INTRAMUSCULAR | Status: DC | PRN

## 2017-11-02 MED ORDER — OXYTOCIN 40 UNITS IN LACTATED RINGERS INFUSION - SIMPLE MED
INTRAVENOUS | Status: AC
  Administered 2017-11-02: 2 m[IU]/min via INTRAVENOUS
  Filled 2017-11-02: qty 1000

## 2017-11-02 MED ORDER — BENZOCAINE-MENTHOL 20-0.5 % EX AERO: 1.0000 "application " | INHALATION_SPRAY | CUTANEOUS | Status: DC | PRN

## 2017-11-02 MED ORDER — AMMONIA AROMATIC IN INHA: 0.3000 mL | Freq: Once | RESPIRATORY_TRACT | Status: DC | PRN

## 2017-11-02 MED ORDER — DIBUCAINE 1 % RE OINT: 1.0000 "application " | TOPICAL_OINTMENT | RECTAL | Status: DC | PRN

## 2017-11-02 MED ORDER — PHENYLEPHRINE 40 MCG/ML (10ML) SYRINGE FOR IV PUSH (FOR BLOOD PRESSURE SUPPORT)
80.0000 ug | PREFILLED_SYRINGE | INTRAVENOUS | Status: DC | PRN
  Filled 2017-11-02: qty 5

## 2017-11-02 MED ORDER — ONDANSETRON HCL 4 MG/2ML IJ SOLN: 4.0000 mg | Freq: Four times a day (QID) | INTRAMUSCULAR | Status: DC | PRN

## 2017-11-02 MED ORDER — FENTANYL 2.5 MCG/ML W/ROPIVACAINE 0.15% IN NS 100 ML EPIDURAL (ARMC)
EPIDURAL | Status: AC
  Filled 2017-11-02: qty 100

## 2017-11-02 MED ORDER — WITCH HAZEL-GLYCERIN EX PADS: 1.0000 "application " | MEDICATED_PAD | CUTANEOUS | Status: DC | PRN

## 2017-11-02 MED ORDER — OXYTOCIN 10 UNIT/ML IJ SOLN
INTRAMUSCULAR | Status: AC
  Filled 2017-11-02: qty 2

## 2017-11-02 MED ORDER — ONDANSETRON HCL 4 MG/2ML IJ SOLN: 4.0000 mg | INTRAMUSCULAR | Status: DC | PRN

## 2017-11-02 MED ORDER — TERBUTALINE SULFATE 1 MG/ML IJ SOLN
INTRAMUSCULAR | Status: AC
  Filled 2017-11-02: qty 1

## 2017-11-02 MED ORDER — OXYTOCIN 40 UNITS IN LACTATED RINGERS INFUSION - SIMPLE MED: 2.5000 [IU]/h | INTRAVENOUS | Status: DC

## 2017-11-02 MED ORDER — OXYTOCIN BOLUS FROM INFUSION
500.0000 mL | Freq: Once | INTRAVENOUS | Status: AC
  Administered 2017-11-02: 500 mL via INTRAVENOUS

## 2017-11-02 MED ORDER — EPHEDRINE 5 MG/ML INJ
10.0000 mg | INTRAVENOUS | Status: DC | PRN
  Filled 2017-11-02: qty 2

## 2017-11-02 MED ORDER — LACTATED RINGERS IV SOLN: 500.0000 mL | Freq: Once | INTRAVENOUS | Status: DC

## 2017-11-02 MED ORDER — ONDANSETRON HCL 4 MG PO TABS: 4.0000 mg | ORAL_TABLET | ORAL | Status: DC | PRN

## 2017-11-02 MED ORDER — BUTORPHANOL TARTRATE 1 MG/ML IJ SOLN
1.0000 mg | INTRAMUSCULAR | Status: DC | PRN
  Administered 2017-11-02 (×2): 1 mg via INTRAVENOUS
  Filled 2017-11-02 (×2): qty 1

## 2017-11-02 MED ORDER — HYDROCODONE-ACETAMINOPHEN 5-325 MG PO TABS: 1.0000 | ORAL_TABLET | Freq: Four times a day (QID) | ORAL | Status: DC | PRN

## 2017-11-02 MED ORDER — MISOPROSTOL 200 MCG PO TABS: 800.0000 ug | ORAL_TABLET | Freq: Once | ORAL | Status: DC | PRN

## 2017-11-02 MED ORDER — FERROUS SULFATE 325 (65 FE) MG PO TABS
325.0000 mg | ORAL_TABLET | Freq: Two times a day (BID) | ORAL | Status: DC
  Administered 2017-11-03 – 2017-11-04 (×3): 325 mg via ORAL
  Filled 2017-11-02 (×3): qty 1

## 2017-11-02 MED ORDER — SENNOSIDES-DOCUSATE SODIUM 8.6-50 MG PO TABS
2.0000 | ORAL_TABLET | ORAL | Status: DC
  Administered 2017-11-03 – 2017-11-04 (×2): 2 via ORAL
  Filled 2017-11-02 (×3): qty 2

## 2017-11-02 MED ORDER — LACTATED RINGERS IV SOLN
INTRAVENOUS | Status: DC
  Administered 2017-11-02: 07:00:00 via INTRAVENOUS

## 2017-11-02 MED ORDER — TERBUTALINE SULFATE 1 MG/ML IJ SOLN: 0.2500 mg | Freq: Once | INTRAMUSCULAR | Status: DC | PRN

## 2017-11-02 MED ORDER — TETANUS-DIPHTH-ACELL PERTUSSIS 5-2.5-18.5 LF-MCG/0.5 IM SUSP
0.5000 mL | INTRAMUSCULAR | Status: AC | PRN
Start: 2017-11-02 — End: 2017-11-04
  Administered 2017-11-04: 0.5 mL via INTRAMUSCULAR
  Filled 2017-11-02: qty 0.5

## 2017-11-02 MED ORDER — DIPHENHYDRAMINE HCL 25 MG PO CAPS: 25.0000 mg | ORAL_CAPSULE | Freq: Four times a day (QID) | ORAL | Status: DC | PRN

## 2017-11-02 MED ORDER — COCONUT OIL OIL
1.0000 "application " | TOPICAL_OIL | Status: DC | PRN
  Administered 2017-11-02: 1 via TOPICAL

## 2017-11-02 MED ORDER — PRENATAL MULTIVITAMIN CH
1.0000 | ORAL_TABLET | Freq: Every day | ORAL | Status: DC
  Administered 2017-11-03 – 2017-11-04 (×2): 1 via ORAL
  Filled 2017-11-02 (×2): qty 1

## 2017-11-02 NOTE — Discharge Summary (Signed)
OB Discharge Summary     Patient Name: Amy [name] DOB: 02/07/1996 MRN: [id number]  DLennie Hummerate of admission: 11/02/2017 Delivering MD: Thomasene MohairStephen Lakeena Downie, MD  Date of Delivery: 11/02/2017  Date of discharge: 11/04/2017  Admitting diagnosis: 40wks Labor Intrauterine pregnancy: 2447w3d     Secondary diagnosis: None     Discharge diagnosis: Term Pregnancy Delivered                                                                                                Post partum procedures:none  Augmentation: Pitocin and n/a  Complications: None  Hospital course:  Onset of Labor With Vaginal Delivery     22 y.o. yo G1P0 at 347w3d was admitted in Active Labor on 11/02/2017. Patient had an uncomplicated labor course as follows:  Membrane Rupture Time/Date: 12:16 PM ,11/02/2017   Intrapartum Procedures: Episiotomy: None [1]                                         Lacerations:  2nd degree [3];Perineal [11];Vaginal [6]  Patient had a delivery of a Viable infant. 11/02/2017  Information for the patient's newborn:  Georgette ShellWilliams [name], Boy Deosha [[id number]][[id number]]  Delivery Method: Vag-Spont    Pateint had an uncomplicated postpartum course.  She is ambulating, tolerating a regular diet, passing flatus, and urinating well. Patient is discharged home in stable condition on 11/04/17.   Physical exam  Vitals:   11/03/17 0810 11/03/17 1542 11/03/17 2345 11/04/17 0813  BP: 109/67 114/70 112/66 (!) 104/56  Pulse: 81 79 89 73  Resp: 18 18 20 16   Temp: 98.7 F (37.1 C) 98.3 F (36.8 C) 98.2 F (36.8 C) 98.5 F (36.9 C)  TempSrc: Oral Oral Oral Oral  SpO2: 100% 99% 98% 100%  Weight:      Height:       General: alert, cooperative and no distress Lochia: appropriate Uterine Fundus: firm Incision: N/A DVT Evaluation: No evidence of DVT seen on physical exam. No cords or calf tenderness. No significant calf/ankle edema.  Labs: Lab Results  Component Value Date   WBC 11.8 (H) 11/03/2017   HGB 9.4 (L)  11/03/2017   HCT 29.0 (L) 11/03/2017   MCV 80.6 11/03/2017   PLT 227 11/03/2017    Discharge instruction: per After Visit Summary.  Medications:  Allergies as of 11/04/2017   No Known Allergies     Medication List    TAKE these medications   prenatal multivitamin Tabs tablet Take 1 tablet by mouth daily at 12 noon.            Discharge Care Instructions  (From admission, onward)         Start     Ordered   11/04/17 0000  Discharge wound care:    Comments:  Perform wound care instructions   11/04/17 1127          Diet: routine diet  Activity: Advance as tolerated. Pelvic rest for 6 weeks.   Outpatient follow up: Follow-up Information  Conard Novak, MD. Schedule an appointment as soon as possible for a visit in 2 week(s).   Specialty:  Obstetrics and Gynecology Why:  postpartum mood check Contact information: [address] [place] [address] [phone]        [facility], [place]. Schedule an appointment as soon as possible for a visit in 6 week(s).   Why:  For Postpartum Assessment Contact information: [address] [place] [address] [phone]             Postpartum contraception: Depo Provera Rhogam Given postpartum: no Rubella vaccine given postpartum: no Varicella vaccine given postpartum: no TDaP given antepartum or postpartum: given prior to discharge  Newborn Data: Live born female  Birth Weight: 7 lb 3.3 oz (3270 g) APGAR: 7, 9  Newborn Delivery   Birth date/time:  11/02/2017 18:34:00 Delivery type:  Vaginal, Spontaneous      Baby Feeding: Breast  Disposition:home with mother  SIGNED: Thomasene Mohair, MD, [facility], [facility] 11/04/2017 11:29 AM

## 2017-11-02 NOTE — Progress Notes (Signed)
   11/02/17 1115  Clinical Encounter Type  Visited With Patient and family together  Visit Type Initial (advanced directive order)  Referral From Nurse  Consult/Referral To Chaplain   Chaplain responded to order for advanced directive.  Patient in bed, surrounded by visitors.  Patient did not engage verbally, her focus appeared inward.  Woman sitting in chair reported that patient had just been given pitocin and was focusing on contractions.  Chaplain used empathy and active and reflective listening, reviewed receiving order for advanced directive, and offered to leave document and return at a later time if desired.  Patient did nod once; visitors concurred.  Chaplain spoke of ongoing chaplain support available for patient and family in addition to advanced directives and encouraged them to have chaplain paged as needed.  Family open to chaplain holding patient, child, and family in her prayers.

## 2017-11-02 NOTE — Progress Notes (Signed)
Labor Check  Subj:  Complaints: none. Comfortable with epidural.   Obj:  BP 118/66   Pulse 87   Temp 98.2 F (36.8 C) (Oral)   Resp 15   Ht 5\' 7"  (1.702 m)   Wt 68.9 kg   LMP 02/14/2017 (LMP Unknown)   SpO2 99%   BMI 23.81 kg/m  Dose (milli-units/min) Oxytocin: 1 milli-units/min  Cervix: Dilation: 10 / Effacement (%): 90 / Station: Plus 3  Baseline FHR: 135 beats/min   Variability: moderate   Accelerations: absent Decelerations: present (repetitive early decelerations) Contractions: present frequency: 3-4 q 10 min Overall assessment: cat 2  A/P: 22 y.o. G1P0 female at 9212w3d with labor, now second stage.  1.  Labor: continue current pitocin dose. Attempt pushing, but may labor down for 30 - 60 minutes, if ineffective.  2.  FWB: reassuring overall given clinical scenario, Overall assessment: category 2  3.  GBS negative  4.  Pain: epidural - well controlled 5.  Recheck: prn   Thomasene MohairStephen Nevada Mullett, MD, Merlinda FrederickFACOG [facility], [facility] 11/02/2017 4:46 PM

## 2017-11-02 NOTE — Anesthesia Procedure Notes (Signed)
Epidural Patient location during procedure: OB Start time: 11/02/2017 11:48 AM End time: 11/02/2017 12:06 PM  Staffing Anesthesiologist: Alver FisherPenwarden, Antiono Ettinger, MD Performed: anesthesiologist   Preanesthetic Checklist Completed: patient identified, site marked, surgical consent, pre-op evaluation, timeout performed, IV checked, risks and benefits discussed and monitors and equipment checked  Epidural Patient position: sitting Prep: ChloraPrep Patient monitoring: heart rate, continuous pulse ox and blood pressure Approach: midline Location: L3-L4 Injection technique: LOR saline  Needle:  Needle type: Tuohy  Needle gauge: 18 G Needle length: 9 cm and 9 Needle insertion depth: 5 cm Catheter type: closed end flexible Catheter size: 20 Guage Catheter at skin depth: 9 cm Test dose: negative (0.125% bupivacaine)  Assessment Events: blood not aspirated, injection not painful, no injection resistance, negative IV test and no paresthesia  Additional Notes   Patient tolerated the insertion well without complications.Reason for block:procedure for pain

## 2017-11-02 NOTE — H&P (Signed)
OB History & Physical   History of Present Illness:  Chief Complaint:  Painful contractions since 0130 HPI:  Amy [name] is a 22 y.o. G1P0 female with EDC=10/30/2017 at 7689w3d dated by a 26 week ultrasound.  Her pregnancy has been complicated by late onset of care, and a history of anxiety and  depression. Has not been on medications for these problems during her pregnancy..  She presents to L&D for evaluation of labor.    [facility]ren[facility] care [facility]  [facility] Prenatal Labs  Dating 26wk2d ultrasound Blood type: O/Positive/-- (04/03 1507)   Genetic Screen 1 Screen:    AFP:     Quad:     NIPS: Antibody:Negative (04/03 1507)  Anatomic US complete Rubella: 1.25 (04/03 1507) Varicella Immune  GTT Early:               Third trimester: 129 RPR: Non Reactive (04/03 1507)   Flu vaccine  HBsAg: Negative (04/03 1507)   TDaP vaccine no                                               Rhogam: not needed HIV: Non Reactive (04/03 1507)   Baby Food  Breast  And bottle                                           GBS: negative  Contraception  Depo Pap:  Circumcision  +MJ on UDS 4/3  Pediatrician    Support Person            Maternal Medical History:   Past Medical History:  Diagnosis Date  . Anxiety   . Asthma    pt states she has not taken medications for this since age 89  . Depression     Past Surgical History:  Procedure Laterality Date  . NO PAST SURGERIES      No Known Allergies  Prior to Admission medications   Medication Sig Start Date End Date Taking? Authorizing Provider  Prenatal Vit-Fe Fumarate-FA (PRENATAL MULTIVITAMIN) TABS tablet Take 1 tablet by mouth daily at 12 noon.   Yes [provider]     Social History: She  reports that she quit smoking about 6 months ago. Her smoking use included cigarettes. She started smoking about 7 months ago. She has a 0.50 pack-year smoking history. She has never  used smokeless tobacco. She reports that she has current or past drug history. Drug: Marijuana. She reports that she does not drink alcohol.  Family History: family history includes Anxiety disorder in her brother; Breast cancer in her maternal grandmother; Depression in her brother, mother, sister, and sister; Diabetes Mellitus II in her maternal grandmother; Premature birth in her sister. She was adopted.   Review of Systems: Negative x 10 systems reviewed except as noted in the HPI.      Physical Exam:  Vital Signs: BP 124/83 (BP Location: Left Arm)   Pulse 80   Temp 97.6 F (36.4 C) (Oral)   Resp 16   Ht 5\' 7"  (1.702 m)   Wt 68.9 kg   LMP 02/14/2017 (LMP Unknown)   BMI 23.81 kg/m  General: gravid WF, breathing through contractions HEENT: normocephalic, atraumatic  piercings in nose, gums, and upper lips Heart: regular rate & rhythm.  No murmurs Lungs: clear to auscultation bilaterally Abdomen: soft, gravid, non-tender;  EFW: 7# Pelvic:   External: Normal external female genitalia  Cervix: Dilation: 1 / Effacement (%): 80 / Station: -1 , then 2.5/80%/-1 2 hours later  Extremities: non-tender, symmetric, no edema bilaterally.  DTRs: +3/3  Neurologic: Alert & oriented x 3.   Baseline FHR: 135 baseline with accelerations to 160s, moderate variability Toco: contractions q2-5 [name] apart, palpate mild to mod  Assessment:  Amy [name] is a 22 y.o. G1P0 female at [redacted]w[redacted]d with early labor FWB: Cat 1 tracing Plan:  1. Admit to Labor & Delivery-anticipate vaginal delivery 2. CBC, T&S, Clrs, IVF 3. GBS negative.   4. Consents obtained. 5. Discussed pain control options-undecided at this time. Stadol and epidural ordered, if desires 6. Breast and bottle 7. Depo for PP contraception 8.  Needs TDAp postpartum  Farrel Conners  11/02/2017 5:31 AM

## 2017-11-02 NOTE — Anesthesia Preprocedure Evaluation (Signed)
Anesthesia Evaluation  Patient identified by MRN, date of birth, ID band Patient awake    Reviewed: Allergy & Precautions, NPO status , Patient's Chart, lab work & pertinent test results  History of Anesthesia Complications Negative for: history of anesthetic complications  Airway Mallampati: II  TM Distance: >3 FB Neck ROM: Full    Dental no notable dental hx.    Pulmonary asthma , former smoker,    breath sounds clear to auscultation- rhonchi (-) wheezing      Cardiovascular Exercise Tolerance: Good (-) hypertension(-) CAD, (-) Past MI, (-) Cardiac Stents and (-) CABG  Rhythm:Regular Rate:Normal - Systolic murmurs and - Diastolic murmurs    Neuro/Psych PSYCHIATRIC DISORDERS Anxiety Depression negative neurological ROS     GI/Hepatic negative GI ROS, Neg liver ROS,   Endo/Other  negative endocrine ROSneg diabetes  Renal/GU negative Renal ROS     Musculoskeletal negative musculoskeletal ROS (+)   Abdominal (+) - obese, Gravid abdomen  Peds  Hematology negative hematology ROS (+)   Anesthesia Other Findings Past Medical History: No date: Anxiety No date: Asthma     Comment:  pt states she has not taken medications for this since               age 22 No date: Depression   Reproductive/Obstetrics (+) Pregnancy                             Lab Results  Component Value Date   WBC 8.9 11/02/2017   HGB 11.4 (L) 11/02/2017   HCT 33.2 (L) 11/02/2017   MCV 79.7 (L) 11/02/2017   PLT 266 11/02/2017    Anesthesia Physical Anesthesia Plan  ASA: II  Anesthesia Plan: Epidural   Post-op Pain Management:    Induction:   PONV Risk Score and Plan: 2  Airway Management Planned:   Additional Equipment:   Intra-op Plan:   Post-operative Plan:   Informed Consent: I have reviewed the patients History and Physical, chart, labs and discussed the procedure including the risks, benefits and  alternatives for the proposed anesthesia with the patient or authorized representative who has indicated his/her understanding and acceptance.     Plan Discussed with: CRNA and Anesthesiologist  Anesthesia Plan Comments: (Plan for epidural for labor, discussed epidural vs spinal vs GA if need for csection)        Anesthesia Quick Evaluation

## 2017-11-02 NOTE — OB Triage Note (Signed)
Pt is a 22 y.o G1P0 6255w3d presents in triage w/ c/o of ctx and spotting. Pt states her ctx started around 0130 and have progressively gotten worst. Pt rates her pain a 9/10. Pt believes her water broke while using the restroom in triage. Pt states positive fetal movement.Monitors applied and assessing.

## 2017-11-02 NOTE — Progress Notes (Signed)
Labor Check  Subj:  Complaints: more comfortable with epidural.   Call to room by RN due to deep, prolonged deceleration.  Per RN, event occurred just preceding SROM of clear-pink fluid.   Obj:  BP 138/89   Pulse 75   Temp 97.8 F (36.6 C) (Oral)   Resp 15   Ht 5\' 7"  (1.702 m)   Wt 68.9 kg   LMP 02/14/2017 (LMP Unknown)   SpO2 100%   BMI 23.81 kg/m  Dose (milli-units/min) Oxytocin: 4 milli-units/min  Cervix: Dilation: 4 / Effacement (%): 90 / Station: -2  Baseline FHR: 125 beats/min   Variability: moderate   Accelerations: absent   Decelerations: present  Deceleration onset 1212 hitting nadir of about 59 at 1214 with slow return to baseline.  FHR jumped to 110 at 1216, then down to 90s and then consistently above 100 starting at 1219. To 110s consistently at 1221, in the 120s consistently at 1224. Moderately variability at 1217 and onward.    Intrauterine resuscitative measures: lateral position changes, maternal O2, stop pitocin, knee chest position ultimately increased heart rate out of 100s.  After being in this position with FHR in 120s with moderate variability, patient changed back to right lateral with continued FHR in 120s with moderate variability.     Contractions: present frequency: 4-5 q 10 min Overall assessment: cat 2 for this episode.  No category 1.   A/P: 22 y.o. G1P0 female at 2146w3d with admission for labor, now augmenting.  1.  Labor: hold pitocin until category 1 for 30 minutes. Tentative plan for restart at 1300.  2.  FWB: reassuring now, Overall assessment: category category 1 at the end of this episode. The episode preceded by epidural placement and SROM.   3.  GBS negative (7/10)  4.  Pain: epidural  5.  Recheck: prn   Thomasene MohairStephen Ioane Bhola, MD, Merlinda FrederickFACOG [facility], [facility] 11/02/2017 12:38 PM

## 2017-11-02 NOTE — Progress Notes (Signed)
  Labor Progress Note   22 y.o. G1P0 @ 2484w3d , admitted for  Pregnancy, Labor Management.   Patient seen at 9:40 AM  Subjective:  Patient is coping well with contractions since she received IV Stadol  Objective:  BP 128/81   Pulse 84   Temp 97.8 F (36.6 C) (Oral)   Resp 15   Ht 5\' 7"  (1.702 m)   Wt 68.9 kg   LMP 02/14/2017 (LMP Unknown)   BMI 23.81 kg/m  Abd: mild Extr: no edema SVE: CERVIX: 3 cm dilated, 90 effaced, -2 station  EFM: FHR: 125 bpm, variability: moderate,  accelerations:  Present,  decelerations:  Absent Toco: Frequency: Every 3-5 minutes Labs: I have reviewed the patient's lab results.   Assessment & Plan:  G1P0 @ 3284w3d, admitted for  Pregnancy and Labor/Delivery Management  1. Pain management: IV Stadol 1 dose given. 2. FWB: FHT category I.  3. ID: GBS negative 4. Labor management: augment as needed  All discussed with patient, see orders   Tresea MallJane Kalianna Verbeke, CNM [facility], [facility] 11/02/2017  10:53 AM

## 2017-11-03 LAB — CBC
HCT: 29 % — ABNORMAL LOW (ref 35.0–47.0)
Hemoglobin: 9.4 g/dL — ABNORMAL LOW (ref 12.0–16.0)
MCH: 26.2 pg (ref 26.0–34.0)
MCHC: 32.5 g/dL (ref 32.0–36.0)
MCV: 80.6 fL (ref 80.0–100.0)
PLATELETS: 227 10*3/uL (ref 150–440)
RBC: 3.59 MIL/uL — AB (ref 3.80–5.20)
RDW: 15.2 % — ABNORMAL HIGH (ref 11.5–14.5)
WBC: 11.8 10*3/uL — ABNORMAL HIGH (ref 3.6–11.0)

## 2017-11-03 LAB — RPR: RPR Ser Ql: NONREACTIVE

## 2017-11-03 NOTE — Progress Notes (Signed)
PPD#1 SVD Subjective:  Well-appearing, tired. Skin to skin with baby. Having some afterpains, will try Motrin for relief.  Voiding without difficulty. Tolerating a regular diet. Ambulating without dizziness.  Objective:   Blood pressure 109/67, pulse 81, temperature 98.7 F (37.1 C), temperature source Oral, resp. rate 18, height 5\' 7"  (1.702 m), weight 68.9 kg, last menstrual period 02/14/2017, SpO2 100 %, currently breastfeeding.  General: NAD Pulmonary: no increased work of breathing Abdomen: non-distended, non-tender Uterus:  lochia appropriate Extremities: no signs of DVT  Results for orders placed or performed during the hospital encounter of 11/02/17 (from the past 72 hour(s))  CBC     Status: Abnormal   Collection Time: 11/02/17  6:45 AM  Result Value Ref Range   WBC 8.9 3.6 - 11.0 K/uL   RBC 4.17 3.80 - 5.20 MIL/uL   Hemoglobin 11.4 (L) 12.0 - 16.0 g/dL   HCT 16.133.2 (L) 09.635.0 - 04.547.0 %   MCV 79.7 (L) 80.0 - 100.0 fL   MCH 27.4 26.0 - 34.0 pg   MCHC 34.3 32.0 - 36.0 g/dL   RDW 40.915.0 (H) 81.111.5 - 91.414.5 %   Platelets 266 150 - 440 K/uL    Comment: Performed at [facility], [address] [place], [place] [address]  Type and screen [facility]     Status: None   Collection Time: 11/02/17  6:45 AM  Result Value Ref Range   ABO/RH(D) O POS    Antibody Screen NEG    Sample Expiration      11/05/2017 Performed at [facility], [address] [place], [place] [address]   RPR     Status: None   Collection Time: 11/02/17  6:45 AM  Result Value Ref Range   RPR Ser Ql Non Reactive Non Reactive    Comment: (NOTE) Performed At: [facility] [address] [place], [place] [id number] Jolene SchimkeNagendra Sanjai MD EX:[id number]:[phone]   CBC     Status: Abnormal   Collection Time: 11/03/17  7:14 AM  Result Value Ref Range   WBC 11.8 (H) 3.6 - 11.0 K/uL   RBC 3.59 (L) 3.80 - 5.20 MIL/uL   Hemoglobin 9.4 (L) 12.0 - 16.0 g/dL   HCT 10.229.0 (L) 72.535.0 -  47.0 %   MCV 80.6 80.0 - 100.0 fL   MCH 26.2 26.0 - 34.0 pg   MCHC 32.5 32.0 - 36.0 g/dL   RDW 36.615.2 (H) 44.011.5 - 34.714.5 %   Platelets 227 150 - 440 K/uL    Comment: Performed at [facility], [address] [place], [place] [address]    Assessment:   22 y.o. G1P1001 postpartum day # 1 in good condition.  Plan:   1) Acute blood loss anemia - hemodynamically stable and asymptomatic - PO ferrous sulfate  2) Blood Type --/--/O POS (08/08 0645) /   3) Rubella 1.25 (04/03 1507) / Varicella Immune / TDAP: needs postpartum  4) Breast feeding  5) Contraception: Depo Provera  6) Disposition: continue postpartum care. Anticipate discharge tomorrow.  Marcelyn BruinsJacelyn Tahjae Clausing, CNM 11/03/2017

## 2017-11-03 NOTE — Discharge Instructions (Signed)
Call your doctor for increased pain or vaginal bleeding, temperature above 100.4, depression, or concerns.  Increase calories and fluids while breastfeeding.  Continue prenatal vitamin and iron.  No strenuous activity or heavy lifting for 6 weeks. No intercourse, tampons, or douching for 6 weeks.  No tub baths- showers only.  No driving for 2 weeks or while taking pain medications.   °

## 2017-11-03 NOTE — Lactation Note (Signed)
This note was copied from a baby's chart. Lactation Consultation Note  Patient Name: Amy Mackey BirchwoodVictoria Williams Green [id number]'s Date: 11/03/2017 Reason for consult: Follow-up assessment   Maternal Data Has patient been taught Hand Expression?: Yes  Feeding Feeding Type: Breast Fed Length of feed: 35 min  LATCH Score Latch: Repeated attempts needed to sustain latch, nipple held in mouth throughout feeding, stimulation needed to elicit sucking reflex.  Audible Swallowing: Spontaneous and intermittent  Type of Nipple: Everted at rest and after stimulation  Comfort (Breast/Nipple): Filling, red/small blisters or bruises, mild/mod discomfort  Hold (Positioning): Assistance needed to correctly position infant at breast and maintain latch.  LATCH Score: 7  Interventions Interventions: Assisted with latch;Adjust position;Support pillows  Lactation Tools Discussed/Used     Consult Status Consult Status: Follow-up Date: 11/03/17 Follow-up type: In-patient  LC to room to assist with breastfeeding. Infant was initially very sleepy when attempting to latch and was placed skin to skin with warm blankets. After a few minutes of skin to skin, infant was placed in the football position at mother's right breast. LC assessed infant's suck using a gloved finger and was initially chewing with uncoordinated suck that improved has he continued to suck. Infant was then assisted to latch to the breast and was able to latch after a few attempts. Mother states that latch was not painful with soft tugs. Infant breast-fed for 35 minutes.  [name] 11/03/2017, 12:33 PM

## 2017-11-03 NOTE — Lactation Note (Signed)
This note was copied from a baby's chart. Lactation Consultation Note  Patient Name: Amy [name] Green ZOXWR'UToday's Date: 11/03/2017 Reason for consult: Initial assessment   Maternal Data Has patient been taught Hand Expression?: Yes  Feeding Feeding Type: Breast Fed(attempted infant sleepy)  LATCH Score Latch: Too sleepy or reluctant, no latch achieved, no sucking elicited.  Audible Swallowing: None  Type of Nipple: Everted at rest and after stimulation  Comfort (Breast/Nipple): Soft / non-tender  Hold (Positioning): Full assist, staff holds infant at breast  LATCH Score: 4  Interventions Interventions: Skin to skin  Lactation Tools Discussed/Used     Consult Status   LC to room to assist with breastfeeding. Infant is showing cues in crib and was unwrapped and placed on mother to breastfeed. Infant could open mouth wide but would not suck and fell asleep. Infant did this after several attempts at trying to latch. Infant was placed skin to skin with mother and warm blankets were placed on infant.   [name] 11/03/2017, 10:02 AM

## 2017-11-03 NOTE — Plan of Care (Signed)
Vs stable; up ad lib (RN has assisted up twice, pt may get up on her own OR she can get assistance); tolerating regular diet; has declined motrin for pain control; breastfeeding and does needs some assistance

## 2017-11-03 NOTE — Anesthesia Postprocedure Evaluation (Signed)
Anesthesia Post Note  Patient: Amy HummerVictoria L Williams Green  Procedure(s) Performed: AN AD HOC LABOR EPIDURAL  Patient location during evaluation: Mother Baby Anesthesia Type: Epidural Level of consciousness: awake, awake and alert and oriented Pain management: pain level controlled Vital Signs Assessment: post-procedure vital signs reviewed and stable Respiratory status: spontaneous breathing Cardiovascular status: blood pressure returned to baseline Postop Assessment: no headache and no backache Anesthetic complications: no     Last Vitals:  Vitals:   11/03/17 0003 11/03/17 0308  BP: 119/69 116/74  Pulse: 87 (!) 101  Resp: 16 18  Temp: 37.1 C 36.9 C  SpO2: 99% 98%    Last Pain:  Vitals:   11/03/17 0308  TempSrc: Oral  PainSc:                  Karoline Caldwelleana Tore Carreker

## 2017-11-04 MED ORDER — IBUPROFEN 600 MG PO TABS
600.0000 mg | ORAL_TABLET | Freq: Four times a day (QID) | ORAL | Status: DC
  Administered 2017-11-04: 600 mg via ORAL
  Filled 2017-11-04: qty 1

## 2017-11-04 NOTE — Plan of Care (Signed)
Vs stable; up ad lib; tolerating regular diet; taking scheduled motrin for pain control

## 2017-11-04 NOTE — Progress Notes (Signed)
Discharge instructions provided.  Pt and sig other verbalize understanding of all instructions and follow-up care.  Pt discharged to home with infant at 1650 on 11/04/17 via wheelchair by RN. Reynold BowenSusan Paisley Aria Pickrell, RN 11/04/2017 8:07 PM

## 2017-11-04 NOTE — Lactation Note (Signed)
This note was copied from a baby's chart. Lactation Consultation Note  Patient Name: Amy [name] Green [id number]'s Date: 11/04/2017 Reason for consult: Follow-up assessment;Primapara   Maternal Data Formula Feeding for Exclusion: No Does the patient have breastfeeding experience prior to this delivery?: No  Feeding Feeding Type: Breast Fed Length of feed: (FOB helped mom, right side)  LATCH Score Latch: Grasps breast easily, tongue down, lips flanged, rhythmical sucking.  Audible Swallowing: Spontaneous and intermittent  Type of Nipple: Everted at rest and after stimulation  Comfort (Breast/Nipple): Filling, red/small blisters or bruises, mild/mod discomfort  Hold (Positioning): No assistance needed to correctly position infant at breast.  LATCH Score: 9  Interventions Interventions: Coconut oil  Lactation Tools Discussed/Used WIC Program: Yes   Consult Status Consult Status: PRN    Dyann KiefMarsha D Ezella Kell 11/04/2017, 12:44 PM

## 2017-11-06 ENCOUNTER — Encounter: Payer: Medicaid Other | Admitting: Obstetrics and Gynecology

## 2017-11-21 ENCOUNTER — Encounter: Payer: Self-pay | Admitting: Advanced Practice Midwife

## 2017-11-21 ENCOUNTER — Ambulatory Visit (INDEPENDENT_AMBULATORY_CARE_PROVIDER_SITE_OTHER): Payer: Medicaid Other | Admitting: Advanced Practice Midwife

## 2017-11-21 DIAGNOSIS — Z30013 Encounter for initial prescription of injectable contraceptive: Secondary | ICD-10-CM

## 2017-11-21 MED ORDER — MEDROXYPROGESTERONE ACETATE 150 MG/ML IM SUSP: 150.0000 mg | INTRAMUSCULAR | 3 refills | Status: DC

## 2017-11-21 NOTE — Progress Notes (Signed)
S: The patient is here today for 2 week postpartum follow up visit for mood check. She reports doing well overall and does not have any concerns. She does comment the baby wakes up throughout the night to nurse and always wakes up at 4 am. She is adjusting well to life with a newborn and has a good support system. She is still having light bleeding. She does not have any breast concerns. Discussion of importance of sleep and support for mood stability. Also discussed pelvic floor and abdominal muscle exercises.  O: Vital Signs: BP 102/66 (BP Location: Left Arm, Patient Position: Sitting, Cuff Size: Normal)   Pulse 82   Wt 127 lb (57.6 kg)   Breastfeeding? Yes   BMI 19.89 kg/m  Constitutional: Well nourished, well developed female in no acute distress.  HEENT: normal Skin: Warm and dry.  Cardiovascular: Regular rate and rhythm.   Respiratory: Clear to auscultation bilateral. Normal respiratory effort Psych: Alert and Oriented x3. No memory deficits. Normal mood and affect.    Pelvic exam:  is not limited by body habitus EGBUS: within normal limits, perineal laceration healed Vagina: within normal limits and with evidence of suture on maternal right, lacerations healed, blood in the vault Cervix: not evaluated  EPDS score: 10  A: 22 yo G1 P1, 2 week postpartum mood check, no concerns  P: Continue breastfeeding Return for 6 week postpartum visit or as needed  Tresea MallJane Keenya Matera, CNM [facility], [facility]

## 2017-12-22 ENCOUNTER — Ambulatory Visit (INDEPENDENT_AMBULATORY_CARE_PROVIDER_SITE_OTHER): Payer: Medicaid Other | Admitting: Advanced Practice Midwife

## 2017-12-22 ENCOUNTER — Encounter: Payer: Self-pay | Admitting: Advanced Practice Midwife

## 2017-12-22 NOTE — Progress Notes (Signed)
Postpartum Visit  Chief Complaint:  Chief Complaint  Patient presents with  . 6 week post partum    History of Present Illness: Patient is a 22 y.o. G1P1001 presents for postpartum visit.  Review the Delivery Report for details.  Date of delivery: 11/02/2017 Type of delivery: Vaginal delivery - Vacuum or forceps assisted  no Episiotomy No.  Laceration: yes 2nd degree repaired with 3.0 vicryl  Pregnancy or labor problems:  no Any problems since the delivery:  no  Newborn Details:  SINGLETON :  1. BabyGender female. Birth weight: 7 pounds 3.3 ounces, 3270 g Maternal Details:  Breast or formula feeding: was breastfeeding initially and now feeding formula Intercourse: No  Contraception after delivery: Rx sent for Depo. She has not yet started. Any bowel or bladder issues: No  Post partum depression/anxiety noted:  no Edinburgh Post-Partum Depression Score: 2 Date of last PAP: patient has not had a PAP smear due to age  Review of Systems: Review of Systems  Constitutional: Negative.   HENT: Negative.   Eyes: Negative.   Respiratory: Negative.   Cardiovascular: Negative.   Gastrointestinal: Negative.   Genitourinary: Negative.   Musculoskeletal: Negative.   Skin: Negative.   Neurological: Negative.   Endo/Heme/Allergies: Negative.   Psychiatric/Behavioral: Negative.      Past Medical History:  Past Medical History:  Diagnosis Date  . Anxiety   . Asthma    pt states she has not taken medications for this since age 35  . Depression     Past Surgical History:  Past Surgical History:  Procedure Laterality Date  . NO PAST SURGERIES      Family History:  Family History  Adopted: Yes  Problem Relation Age of Onset  . Breast cancer Maternal Grandmother   . Diabetes Mellitus II Maternal Grandmother   . Depression Mother   . Depression Sister   . Depression Brother   . Anxiety disorder Brother   . Premature birth Sister   . Depression Sister     Social  History:  Social History   Socioeconomic History  . Marital status: Single    Spouse name: Not on file  . Number of children: Not on file  . Years of education: Not on file  . Highest education level: 11th grade  Occupational History  . Not on file  Social Needs  . Financial resource strain: Not hard at all  . Food insecurity:    Worry: Never true    Inability: Never true  . Transportation needs:    Medical: No    Non-medical: No  Tobacco Use  . Smoking status: Former Smoker    Packs/day: 0.50    Years: 1.00    Pack years: 0.50    Types: Cigarettes    Start date: 03/26/2017    Last attempt to quit: 04/04/2017    Years since quitting: 0.7  . Smokeless tobacco: Never Used  Substance and Sexual Activity  . Alcohol use: No  . Drug use: Not Currently    Types: Marijuana    Comment: had +MJ screen at [facility]  . Sexual activity: Not Currently    Birth control/protection: Injection  Lifestyle  . Physical activity:    Days per week: 7 days    Minutes per session: 20 min  . Stress: Only a little  Relationships  . Social connections:    Talks on phone: More than three times a week    Gets together: More than three times a  week    Attends religious service: Never    Active member of club or organization: No    Attends meetings of clubs or organizations: Never    Relationship status: Never married  . Intimate partner violence:    Fear of current or ex partner: No    Emotionally abused: No    Physically abused: No    Forced sexual activity: No  Other Topics Concern  . Not on file  Social History Narrative   Lives with adoptive father and her boyfriend; baby boy is due 10/30/2017      Mom and older sister live in [place] and she does have periodic contact with them.    Allergies:  No Known Allergies  Medications: Prior to Admission medications   Medication Sig Start Date End Date Taking? Authorizing Provider  medroxyPROGESTERone (DEPO-PROVERA) 150 MG/ML injection Inject 1 mL  (150 mg total) into the muscle every 3 (three) months. Patient not taking: Reported on 12/22/2017 11/21/17 11/16/18  Tresea Mall, CNM  Prenatal Vit-Fe Fumarate-FA (PRENATAL MULTIVITAMIN) TABS tablet Take 1 tablet by mouth daily at 12 noon.    [provider]    Physical Exam Blood pressure 114/70, height 5\' 7"  (1.702 m), weight 128 lb (58.1 kg)    General: NAD HEENT: normocephalic, anicteric Pulmonary: No increased work of breathing Abdomen: NABS, soft, non-tender, non-distended.  Umbilicus without lesions.  No hepatomegaly, splenomegaly or masses palpable. No evidence of hernia. Genitourinary:  External: Normal external female genitalia.  Normal urethral meatus, normal  Bartholin's and Skene's glands.    Vagina: Normal vaginal mucosa, no evidence of prolapse.    Cervix: Grossly normal in appearance, no bleeding, no CMT  Uterus: Non-enlarged, mobile, normal contour.    Adnexa: ovaries non-enlarged, no adnexal masses  Rectal: deferred Extremities: no edema, erythema, or tenderness Neurologic: Grossly intact Psychiatric: mood appropriate, affect full    Assessment: 22 y.o. G1P1001 presenting for 6 week postpartum visit  Plan: Problem List Items Addressed This Visit    None    Visit Diagnoses    6 weeks postpartum follow-up    -  Primary       1) Contraception - Education given regarding options for contraception, as well as compatibility with breast feeding if applicable.  Patient plans on Depo-Provera injections for contraception.  2)  Pap - ASCCP guidelines and rational discussed.  ASCCP guidelines and rational discussed.  Patient opts for begin at age 37 screening interval  3) Patient underwent screening for postpartum depression with no signs of depression  4) Return in about 1 year (around 12/23/2018) for annual established gyn.   Tresea Mall, CNM [facility], [facility] 12/22/2017, 1:27 PM

## 2018-04-15 ENCOUNTER — Encounter: Payer: Self-pay | Admitting: Emergency Medicine

## 2018-04-15 ENCOUNTER — Other Ambulatory Visit: Payer: Self-pay

## 2018-04-15 ENCOUNTER — Emergency Department
Admission: EM | Admit: 2018-04-15 | Discharge: 2018-04-15 | Disposition: A | Discharge status: Home / Self Care | Payer: Medicaid Other | Attending: Emergency Medicine | Admitting: Emergency Medicine

## 2018-04-15 DIAGNOSIS — J45909 Unspecified asthma, uncomplicated: Secondary | ICD-10-CM | POA: Diagnosis not present

## 2018-04-15 DIAGNOSIS — F17210 Nicotine dependence, cigarettes, uncomplicated: Secondary | ICD-10-CM | POA: Insufficient documentation

## 2018-04-15 DIAGNOSIS — R109 Unspecified abdominal pain: Secondary | ICD-10-CM

## 2018-04-15 DIAGNOSIS — N390 Urinary tract infection, site not specified: Secondary | ICD-10-CM

## 2018-04-15 LAB — URINALYSIS, COMPLETE (UACMP) WITH MICROSCOPIC
BACTERIA UA: NONE SEEN
Glucose, UA: NEGATIVE mg/dL
KETONES UR: 20 mg/dL — AB
NITRITE: NEGATIVE
PH: 5 (ref 5.0–8.0)
Protein, ur: 100 mg/dL — AB
SPECIFIC GRAVITY, URINE: 1.032 — AB (ref 1.005–1.030)

## 2018-04-15 LAB — BASIC METABOLIC PANEL
Anion gap: 8 (ref 5–15)
BUN: 13 mg/dL (ref 6–20)
CALCIUM: 9.3 mg/dL (ref 8.9–10.3)
CHLORIDE: 105 mmol/L (ref 98–111)
CO2: 26 mmol/L (ref 22–32)
Creatinine, Ser: 0.78 mg/dL (ref 0.44–1.00)
GFR calc non Af Amer: >60 mL/min (ref >60)
Glucose, Bld: 101 mg/dL — ABNORMAL HIGH (ref 70–99)
Potassium: 3.5 mmol/L (ref 3.5–5.1)
Sodium: 139 mmol/L (ref 135–145)

## 2018-04-15 LAB — CBC
HEMATOCRIT: 40.4 % (ref 36.0–46.0)
Hemoglobin: 12.9 g/dL (ref 12.0–15.0)
MCH: 28 pg (ref 26.0–34.0)
MCHC: 31.9 g/dL (ref 30.0–36.0)
MCV: 87.6 fL (ref 80.0–100.0)
PLATELETS: 351 10*3/uL (ref 150–400)
RBC: 4.61 MIL/uL (ref 3.87–5.11)
RDW: 13.5 % (ref 11.5–15.5)
WBC: 10.4 10*3/uL (ref 4.0–10.5)
nRBC: 0 % (ref 0.0–0.2)

## 2018-04-15 LAB — POCT PREGNANCY, URINE: Preg Test, Ur: NEGATIVE

## 2018-04-15 LAB — LIPASE, BLOOD: Lipase: 22 U/L (ref 11–51)

## 2018-04-15 MED ORDER — SODIUM CHLORIDE 0.9 % IV SOLN
1.0000 g | Freq: Once | INTRAVENOUS | Status: AC
  Administered 2018-04-15: 1 g via INTRAVENOUS
  Filled 2018-04-15: qty 10

## 2018-04-15 MED ORDER — PHENAZOPYRIDINE HCL 200 MG PO TABS: 200.0000 mg | ORAL_TABLET | Freq: Three times a day (TID) | ORAL | 0 refills | Status: DC | PRN

## 2018-04-15 MED ORDER — CEPHALEXIN 500 MG PO CAPS: 500.0000 mg | ORAL_CAPSULE | Freq: Three times a day (TID) | ORAL | 0 refills | Status: DC

## 2018-04-15 MED ORDER — SODIUM CHLORIDE 0.9 % IV BOLUS
1000.0000 mL | Freq: Once | INTRAVENOUS | Status: AC
  Administered 2018-04-15: 1000 mL via INTRAVENOUS

## 2018-04-15 NOTE — ED Provider Notes (Signed)
[facility] Emergency Department Provider Note  ____________________________________________   I have reviewed the triage vital signs and the nursing notes.   HISTORY  Chief Complaint Flank Pain   History limited by: Not Limited   HPI Amy [name] is a 23 y.o. female who presents to the emergency department today because of concerns for left flank pain.  The patient states that the pain started this morning when she woke up.  Initially she thought that she might have slept on it wrong.  She did notice that it was worse when she got up or tried to move around.  She states that she then noticed the pain moving into her groin.  She has not noticed any bad odor or painful urination although states that when she had similar symptoms in the past she was diagnosed with a urinary tract infection.  She denies any fevers.  Denies any trauma.   Per medical record review patient has a history of asthma  Past Medical History:  Diagnosis Date  . Anxiety   . Asthma    pt states she has not taken medications for this since age 14  . Depression     Patient Active Problem List   Diagnosis Date Noted  . Indication for care in labor and delivery, antepartum 11/02/2017  . Normal labor 11/02/2017  . Supervision of high risk pregnancy, antepartum 07/26/2017  . Late prenatal care 07/26/2017  . 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 Eating disorder 08/21/2014  . Generalized anxiety disorder 08/21/2014    Past Surgical History:  Procedure Laterality Date  . NO PAST SURGERIES      Prior to Admission medications   Medication Sig Start Date End Date Taking? Authorizing Provider  medroxyPROGESTERone (DEPO-PROVERA) 150 MG/ML injection Inject 1 mL (150 mg total) into the muscle every 3 (three) months. Patient not taking: Reported on 12/22/2017 11/21/17 11/16/18  Tresea Mall, CNM  Prenatal Vit-Fe Fumarate-FA (PRENATAL  MULTIVITAMIN) TABS tablet Take 1 tablet by mouth daily at 12 noon.    [provider]    Allergies Patient has no known allergies.  Family History  Adopted: Yes  Problem Relation Age of Onset  . Breast cancer Maternal Grandmother   . Diabetes Mellitus II Maternal Grandmother   . Depression Mother   . Depression Sister   . Depression Brother   . Anxiety disorder Brother   . Premature birth Sister   . Depression Sister     Social History Social History   Tobacco Use  . Smoking status: Light Tobacco Smoker    Packs/day: 0.50    Years: 1.00    Pack years: 0.50    Types: Cigarettes    Start date: 03/26/2017    Last attempt to quit: 04/04/2017    Years since quitting: 1.0  . Smokeless tobacco: Never Used  Substance Use Topics  . Alcohol use: No  . Drug use: Yes    Types: Marijuana    Comment: had +MJ screen at [facility]    Review of Systems Constitutional: No fever/chills Eyes: No visual changes. ENT: No sore throat. Cardiovascular: Denies chest pain. Respiratory: Denies shortness of breath. Gastrointestinal: Positive for left flank pain. Genitourinary: Negative for dysuria. Musculoskeletal: Negative for back pain. Skin: Negative for rash. Neurological: Negative for headaches, focal weakness or numbness.  ____________________________________________   PHYSICAL EXAM:  VITAL SIGNS: ED Triage Vitals  Enc Vitals Group     BP 04/15/18 1900 116/75  Pulse Rate 04/15/18 1900 93     Resp 04/15/18 1900 18     Temp 04/15/18 1900 98 F (36.7 C)     Temp Source 04/15/18 1900 Oral     SpO2 04/15/18 1900 100 %     Weight 04/15/18 1901 116 lb (52.6 kg)     Height 04/15/18 1901 5\' 8"  (1.727 m)     Head Circumference --      Peak Flow --      Pain Score 04/15/18 1901 5   Constitutional: Alert and oriented.  Eyes: Conjunctivae are normal.  ENT      Head: Normocephalic and atraumatic.      Nose: No congestion/rhinnorhea.      Mouth/Throat: Mucous membranes are  moist.      Neck: No stridor. Hematological/Lymphatic/Immunilogical: No cervical lymphadenopathy. Cardiovascular: Normal rate, regular rhythm.  No murmurs, rubs, or gallops.  Respiratory: Normal respiratory effort without tachypnea nor retractions. Breath sounds are clear and equal bilaterally. No wheezes/rales/rhonchi. Gastrointestinal: Soft and mild tenderness to palpation over the suprapubic region. No rebound. No guarding.  Genitourinary: Deferred Musculoskeletal: Normal range of motion in all extremities. No lower extremity edema. Neurologic:  Normal speech and language. No gross focal neurologic deficits are appreciated.  Skin:  Skin is warm, dry and intact. No rash noted. Psychiatric: Mood and affect are normal. Speech and behavior are normal. Patient exhibits appropriate insight and judgment.  ____________________________________________    LABS (pertinent positives/negatives)  Upreg neg Lipase 22 CBC wbc 10.4, hgb 12.9, plt 351 BMP wnl except glu 101  ____________________________________________   EKG  None  ____________________________________________    RADIOLOGY  None  ____________________________________________   PROCEDURES  Procedures  ____________________________________________   INITIAL IMPRESSION / ASSESSMENT AND PLAN / ED COURSE  Pertinent labs & imaging results that were available during my care of the patient were reviewed by me and considered in my medical decision making (see chart for details).   Patient with left flank pain. UA concerning for urinary tract infection. Some blood, patient states she is just finishing her menstrual cycle. At this time doubt kidney stone. Will give dose of antibiotics here and further antibiotics on discharge.   ____________________________________________   FINAL CLINICAL IMPRESSION(S) / ED DIAGNOSES  Final diagnoses:  Left flank pain  Lower urinary tract infectious disease     Note: This dictation  was prepared with Dragon dictation. Any transcriptional errors that result from this process are unintentional     Phineas SemenGoodman, Thoms Barthelemy, MD 04/16/18 [phone]

## 2018-04-15 NOTE — ED Triage Notes (Signed)
Pt arrives POV to triage with c/o right side flank pain which started in her lower back a couple of days ago. Pt reports no urinary symptoms at this time other then feeling that she needs to void and a little bit coming out. Pt is in NAD.

## 2018-04-15 NOTE — Discharge Instructions (Signed)
Please seek medical attention for any high fevers, chest pain, shortness of breath, change in behavior, persistent vomiting, bloody stool or any other new or concerning symptoms.  

## 2018-04-18 LAB — URINE CULTURE: Culture: 30000 — AB

## 2018-04-27 ENCOUNTER — Encounter: Payer: Self-pay | Admitting: Family Medicine

## 2018-04-27 ENCOUNTER — Ambulatory Visit (INDEPENDENT_AMBULATORY_CARE_PROVIDER_SITE_OTHER): Payer: Medicaid Other | Admitting: Family Medicine

## 2018-04-27 ENCOUNTER — Ambulatory Visit (INDEPENDENT_AMBULATORY_CARE_PROVIDER_SITE_OTHER): Payer: Medicaid Other

## 2018-04-27 VITALS — BP 110/72 | HR 88 | Temp 98.1°F | Resp 18 | Ht 67.0 in | Wt 119.3 lb

## 2018-04-27 DIAGNOSIS — Z113 Encounter for screening for infections with a predominantly sexual mode of transmission: Secondary | ICD-10-CM

## 2018-04-27 DIAGNOSIS — Z0000 Encounter for general adult medical examination without abnormal findings: Secondary | ICD-10-CM

## 2018-04-27 DIAGNOSIS — Z3042 Encounter for surveillance of injectable contraceptive: Secondary | ICD-10-CM

## 2018-04-27 MED ORDER — MEDROXYPROGESTERONE ACETATE 150 MG/ML IM SUSP
150.0000 mg | Freq: Once | INTRAMUSCULAR | Status: AC
  Administered 2018-04-27: 150 mg via INTRAMUSCULAR

## 2018-04-27 NOTE — Patient Instructions (Signed)
Preventive Care 18-39 Years, Female Preventive care refers to lifestyle choices and visits with your health care provider that can promote health and wellness. What does preventive care include?   A yearly physical exam. This is also called an annual well check.  Dental exams once or twice a year.  Routine eye exams. Ask your health care provider how often you should have your eyes checked.  Personal lifestyle choices, including: ? Daily care of your teeth and gums. ? Regular physical activity. ? Eating a healthy diet. ? Avoiding tobacco and drug use. ? Limiting alcohol use. ? Practicing safe sex. ? Taking vitamin and mineral supplements as recommended by your health care provider. What happens during an annual well check? The services and screenings done by your health care provider during your annual well check will depend on your age, overall health, lifestyle risk factors, and family history of disease. Counseling Your health care provider may ask you questions about your:  Alcohol use.  Tobacco use.  Drug use.  Emotional well-being.  Home and relationship well-being.  Sexual activity.  Eating habits.  Work and work environment.  Method of birth control.  Menstrual cycle.  Pregnancy history. Screening You may have the following tests or measurements:  Height, weight, and BMI.  Diabetes screening. This is done by checking your blood sugar (glucose) after you have not eaten for a while (fasting).  Blood pressure.  Lipid and cholesterol levels. These may be checked every 5 years starting at age 20.  Skin check.  Hepatitis C blood test.  Hepatitis B blood test.  Sexually transmitted disease (STD) testing.  BRCA-related cancer screening. This may be done if you have a family history of breast, ovarian, tubal, or peritoneal cancers.  Pelvic exam and Pap test. This may be done every 3 years starting at age 21. Starting at age 30, this may be done every 5  years if you have a Pap test in combination with an HPV test. Discuss your test results, treatment options, and if necessary, the need for more tests with your health care provider. Vaccines Your health care provider may recommend certain vaccines, such as:  Influenza vaccine. This is recommended every year.  Tetanus, diphtheria, and acellular pertussis (Tdap, Td) vaccine. You may need a Td booster every 10 years.  Varicella vaccine. You may need this if you have not been vaccinated.  HPV vaccine. If you are 26 or younger, you may need three doses over 6 months.  Measles, mumps, and rubella (MMR) vaccine. You may need at least one dose of MMR. You may also need a second dose.  Pneumococcal 13-valent conjugate (PCV13) vaccine. You may need this if you have certain conditions and were not previously vaccinated.  Pneumococcal polysaccharide (PPSV23) vaccine. You may need one or two doses if you smoke cigarettes or if you have certain conditions.  Meningococcal vaccine. One dose is recommended if you are age 19-21 years and a first-year college student living in a residence hall, or if you have one of several medical conditions. You may also need additional booster doses.  Hepatitis A vaccine. You may need this if you have certain conditions or if you travel or work in places where you may be exposed to hepatitis A.  Hepatitis B vaccine. You may need this if you have certain conditions or if you travel or work in places where you may be exposed to hepatitis B.  Haemophilus influenzae type b (Hib) vaccine. You may need this if you   have certain risk factors. Talk to your health care provider about which screenings and vaccines you need and how often you need them. This information is not intended to replace advice given to you by your health care provider. Make sure you discuss any questions you have with your health care provider. Document Released: 05/10/2001 Document Revised: 10/25/2016  Document Reviewed: 01/13/2015 Elsevier Interactive Patient Education  2019 [employer]   

## 2018-04-27 NOTE — Progress Notes (Signed)
Name: Amy [name]   MRN: [id number]    DOB: Dec 29, 1995   Date:04/27/2018       Progress Note  Subjective  Chief Complaint  Chief Complaint  Patient presents with  . Annual Exam    HPI  Amy [name] is a 23 y.o. female presenting for well adult examination. She is seen today alone.  Current Issues:  Nutrition:  Eating behaviors/Nutrition: Skips meals here and there, balanced diet. Adequate Calcium in diet? Yes Supplements/Vitamins? Not taking, but will restart on multivtamin  Exercise/Media: Not exercising Screen Time: Watches TV at night. Not on her phone much  Sleep: Sleep is okay - son is almost 6 months.  Social Screening:  Lives with: Son, Dad, and Younger brother Parental Relations: Good with Dad, occasionally contacts her mother. Sibling Relations: Good Activities/Work/Chores: Not working currently Peers/Friend Relations: Still doing well postpartum Stressors of note: Trying to get a job right now. Alcohol: No Tobacco use: Occasional - Cigarettes every now and then  Education:  School Name: None at this time Highest Level - 12th grade, did not graduate  Menstruation: Having Periods: Yes LMP: 04/08/2018 Are periods regular? Yes Length of cycle? 7 days Sexually Active: Yes Birthcontrol: Yes Type? Depo Innjection Safe at home, in school, and in relationships? Yes HIV: Negative April 2019 STD testing and prevention (chl/gon/syphilis): RPR negative April 2019, will check gon/chlamydia  Depression:  Depression screen [facility] 2/9 04/27/2018 10/24/2017 10/24/2017  Decreased Interest 1 0 0  Down, Depressed, Hopeless 1 0 0  PHQ - 2 Score 2 0 0  Altered sleeping 1 0 -  Tired, decreased energy 0 0 -  Change in appetite 0 0 -  Feeling bad or failure about yourself  - 0 -  Trouble concentrating 0 0 -  Moving slowly or fidgety/restless 0 0 -  Suicidal thoughts 0 0 -  PHQ-9 Score 3 0 -  Difficult doing work/chores Not difficult at all Not  difficult at all -   Hypertension: BP Readings from Last 3 Encounters:  04/27/18 110/72  04/15/18 114/71  12/22/17 114/70   Obesity: Wt Readings from Last 3 Encounters:  04/27/18 119 lb 4.8 oz (54.1 kg)  04/15/18 116 lb (52.6 kg)  12/22/17 128 lb (58.1 kg) (50 %, Z= 0.00)*   * Growth percentiles are based on CDC (Girls, 2-20 Years) data.   BMI Readings from Last 3 Encounters:  04/27/18 18.69 kg/m  04/15/18 17.64 kg/m  12/22/17 20.05 kg/m (28 %, Z= -0.57)*   * Growth percentiles are based on CDC (Girls, 2-20 Years) data.    Breast cancer: MGM with hx; no other family member No results found for: HMMAMMO  BRCA gene screening: Not indicated Cervical cancer screening: Not Due until age 22. Colorectal cancer: Denies family or personal history of colorectal cancer, no changes in BM's - no blood in stool, dark and tarry stool, mucus in stool, or constipation/diarrhea. Skin cancer: No concerning lesions  Lipids:  Lab Results  Component Value Date   CHOL 125 08/22/2014   Lab Results  Component Value Date   HDL 46 08/22/2014   Lab Results  Component Value Date   LDLCALC 65 08/22/2014   Lab Results  Component Value Date   TRIG 68 08/22/2014   Lab Results  Component Value Date   CHOLHDL 2.7 08/22/2014   No results found for: LDLDIRECT  Glucose:  Glucose  Date Value Ref Range Status  10/04/2013 96 65 - 99 mg/dL Final   Glucose,  Bld  Date Value Ref Range Status  04/15/2018 101 (H) 70 - 99 mg/dL Final  08/05/2016 98 65 - 99 mg/dL Final  08/19/2014 100 (H) 65 - 99 mg/dL Final    Patient Active Problem List   Diagnosis Date Noted  . Indication for care in labor and delivery, antepartum 11/02/2017  . Normal labor 11/02/2017  . Supervision of high risk pregnancy, antepartum 07/26/2017  . Late prenatal care 07/26/2017  . Severe recurrent major depression without psychotic features ([place]) 08/21/2014  . Eating disorder 08/21/2014  . Generalized anxiety disorder  08/21/2014    Past Medical History:  Diagnosis Date  . Anxiety   . Asthma    pt states she has not taken medications for this since age 65  . Depression     Past Surgical History:  Procedure Laterality Date  . NO PAST SURGERIES      Family History  Adopted: Yes  Problem Relation Age of Onset  . Breast cancer Maternal Grandmother   . Diabetes Mellitus II Maternal Grandmother   . Depression Mother   . Depression Sister   . Depression Brother   . Anxiety disorder Brother   . Premature birth Sister   . Depression Sister     Social History   Socioeconomic History  . Marital status: Single    Spouse name: Not on file  . Number of children: 1  . Years of education: Not on file  . Highest education level: 11th grade  Occupational History  . Occupation: unemployed  Social Needs  . Financial resource strain: Not hard at all  . Food insecurity:    Worry: Never true    Inability: Never true  . Transportation needs:    Medical: No    Non-medical: No  Tobacco Use  . Smoking status: Light Tobacco Smoker    Packs/day: 0.50    Years: 1.00    Pack years: 0.50    Types: Cigarettes    Start date: 03/26/2017    Last attempt to quit: 04/04/2017    Years since quitting: 1.0  . Smokeless tobacco: Never Used  Substance and Sexual Activity  . Alcohol use: No  . Drug use: Yes    Types: Marijuana    Comment: had +MJ screen at [facility]  . Sexual activity: Yes    Partners: Female    Birth control/protection: Injection  Lifestyle  . Physical activity:    Days per week: 1 day    Minutes per session: 20 min  . Stress: Only a little  Relationships  . Social connections:    Talks on phone: More than three times a week    Gets together: More than three times a week    Attends religious service: Never    Active member of club or organization: No    Attends meetings of clubs or organizations: Never    Relationship status: Never married  . Intimate partner violence:    Fear of current  or ex partner: No    Emotionally abused: No    Physically abused: No    Forced sexual activity: No  Other Topics Concern  . Not on file  Social History Narrative   Lives with adoptive father and her boyfriend; baby boy is due 10/30/2017      Mom and older sister live in [place] and she does have periodic contact with them.     Current Outpatient Medications:  .  medroxyPROGESTERone (DEPO-PROVERA) 150 MG/ML injection, Inject 1 mL (150 mg  total) into the muscle every 3 (three) months., Disp: 1 mL, Rfl: 3 .  cephALEXin (KEFLEX) 500 MG capsule, Take 1 capsule (500 mg total) by mouth 3 (three) times daily. (Patient not taking: Reported on 04/27/2018), Disp: 30 capsule, Rfl: 0 .  phenazopyridine (PYRIDIUM) 200 MG tablet, Take 1 tablet (200 mg total) by mouth 3 (three) times daily as needed for pain. (Patient not taking: Reported on 04/27/2018), Disp: 20 tablet, Rfl: 0  No Known Allergies   ROS  ROS: no wheezing, cough or dyspnea, no chest pain, no abdominal pain, no headaches, no bowel or bladder symptoms, no pain or lumps in groin or testes, no breast pain or lumps.  Objective  Vitals:   04/27/18 0916  BP: 110/72  Pulse: 88  Resp: 18  Temp: 98.1 F (36.7 C)  TempSrc: Oral  SpO2: 97%  Weight: 119 lb 4.8 oz (54.1 kg)  Height: '5\' 7"'  (1.702 m)    Body mass index is 18.69 kg/m.  Physical Exam  Constitutional: Patient appears well-developed and well-nourished. No distress.  HENT: Head: Normocephalic and atraumatic. Ears: B TMs ok, no erythema or effusion; Nose: Nose normal. Mouth/Throat: Oropharynx is clear and moist. No oropharyngeal exudate.  Eyes: Conjunctivae and EOM are normal. Pupils are equal, round, and reactive to light. No scleral icterus.  Neck: Normal range of motion. Neck supple. No JVD present. No thyromegaly present.  Cardiovascular: Normal rate, regular rhythm and normal heart sounds.  No murmur heard. No BLE edema. Pulmonary/Chest: Effort normal and breath sounds  normal. No respiratory distress. Abdominal: Soft. Bowel sounds are normal, no distension. There is no tenderness. no masses Breast: no lumps or masses, no nipple discharge or rashes FEMALE GENITALIA: Deferred Musculoskeletal: Normal range of motion, no joint effusions. No gross deformities Neurological: he is alert and oriented to person, place, and time. No cranial nerve deficit. Coordination, balance, strength, speech and gait are normal.  Skin: Skin is warm and dry. No rash noted. No erythema.  Psychiatric: Patient has a normal mood and affect. behavior is normal. Judgment and thought content normal.  Recent Results (from the past 2160 hour(s))  Urinalysis, Complete w Microscopic     Status: Abnormal   Collection Time: 04/15/18  7:07 PM  Result Value Ref Range   Color, Urine AMBER (A) YELLOW    Comment: BIOCHEMICALS MAY BE AFFECTED BY COLOR   APPearance HAZY (A) CLEAR   Specific Gravity, Urine 1.032 (H) 1.005 - 1.030   pH 5.0 5.0 - 8.0   Glucose, UA NEGATIVE NEGATIVE mg/dL   Hgb urine dipstick LARGE (A) NEGATIVE   Bilirubin Urine SMALL (A) NEGATIVE   Ketones, ur 20 (A) NEGATIVE mg/dL   Protein, ur 100 (A) NEGATIVE mg/dL   Nitrite NEGATIVE NEGATIVE   Leukocytes, UA MODERATE (A) NEGATIVE   RBC / HPF >50 (H) 0 - 5 RBC/hpf   WBC, UA 21-50 0 - 5 WBC/hpf   Bacteria, UA NONE SEEN NONE SEEN   Squamous Epithelial / LPF 21-50 0 - 5   Mucus PRESENT     Comment: Performed at [facility], [address] [place], [place] [address]  Basic metabolic panel     Status: Abnormal   Collection Time: 04/15/18  7:07 PM  Result Value Ref Range   Sodium 139 135 - 145 mmol/L   Potassium 3.5 3.5 - 5.1 mmol/L   Chloride 105 98 - 111 mmol/L   CO2 26 22 - 32 mmol/L   Glucose, Bld 101 (H) 70 -  99 mg/dL   BUN 13 6 - 20 mg/dL   Creatinine, Ser 0.78 0.44 - 1.00 mg/dL   Calcium 9.3 8.9 - 10.3 mg/dL   GFR calc non Af Amer >60 >60 mL/min   GFR calc Af Amer >60 >60 mL/min   Anion gap 8 5 - 15     Comment: Performed at [facility], [address] [place], [place] [address]  CBC     Status: None   Collection Time: 04/15/18  7:07 PM  Result Value Ref Range   WBC 10.4 4.0 - 10.5 K/uL   RBC 4.61 3.87 - 5.11 MIL/uL   Hemoglobin 12.9 12.0 - 15.0 g/dL   HCT 40.4 36.0 - 46.0 %   MCV 87.6 80.0 - 100.0 fL   MCH 28.0 26.0 - 34.0 pg   MCHC 31.9 30.0 - 36.0 g/dL   RDW 13.5 11.5 - 15.5 %   Platelets 351 150 - 400 K/uL   nRBC 0.0 0.0 - 0.2 %    Comment: Performed at [facility], [address] [place], [place] [address]  Lipase, blood     Status: None   Collection Time: 04/15/18  7:07 PM  Result Value Ref Range   Lipase 22 11 - 51 U/L    Comment: Performed at [facility], [address] [place], [place] [address]  Urine Culture     Status: Abnormal   Collection Time: 04/15/18  7:07 PM  Result Value Ref Range   Specimen Description URINE, RANDOM    Special Requests      NONE Performed at [facility], [address] [place], [place] [address]    Culture 30,000 COLONIES/mL LACTOBACILLUS SPECIES (A)    Report Status 04/18/2018 FINAL   Pregnancy, urine POC     Status: None   Collection Time: 04/15/18  7:15 PM  Result Value Ref Range   Preg Test, Ur NEGATIVE NEGATIVE    Comment:        THE SENSITIVITY OF THIS METHODOLOGY IS >24 mIU/mL     Fall Risk: Fall Risk  04/27/2018 10/24/2017  Falls in the past year? 0 No  Number falls in past yr: 0 -  Injury with Fall? 0 -  Follow up Falls evaluation completed -    Assessment & Plan  1. Well woman exam (no gynecological exam) -USPSTF grade A and B recommendations reviewed with patient; age-appropriate recommendations, preventive care, screening tests, etc discussed and encouraged; healthy living encouraged; see AVS for patient education given to patient -Discussed importance of 150 minutes of physical activity weekly, eat two servings of fish weekly, eat one serving of tree nuts ( cashews,  pistachios, pecans, almonds.Marland Kitchen) every other day, eat 6 servings of fruit/vegetables daily and drink plenty of water and avoid sweet beverages.  - Cervicovaginal ancillary only  2. Routine screening for STI (sexually transmitted infection) - Cervicovaginal ancillary only

## 2018-07-20 ENCOUNTER — Other Ambulatory Visit: Payer: Self-pay

## 2018-07-20 ENCOUNTER — Ambulatory Visit (INDEPENDENT_AMBULATORY_CARE_PROVIDER_SITE_OTHER): Payer: Medicaid Other

## 2018-07-20 DIAGNOSIS — Z3042 Encounter for surveillance of injectable contraceptive: Secondary | ICD-10-CM

## 2018-07-20 MED ORDER — MEDROXYPROGESTERONE ACETATE 150 MG/ML IM SUSP
150.0000 mg | Freq: Once | INTRAMUSCULAR | Status: AC
  Administered 2018-07-20: 10:00:00 150 mg via INTRAMUSCULAR

## 2018-07-20 NOTE — Progress Notes (Signed)
Pt did not have injection. Will pick it up and return to office.

## 2018-07-20 NOTE — Addendum Note (Signed)
Addended by: [place], Ryota Treece B on: 07/20/2018 09:36 AM   Modules accepted: Orders

## 2018-10-12 ENCOUNTER — Other Ambulatory Visit: Payer: Self-pay

## 2018-10-12 ENCOUNTER — Ambulatory Visit (INDEPENDENT_AMBULATORY_CARE_PROVIDER_SITE_OTHER): Payer: Medicaid Other

## 2018-10-12 DIAGNOSIS — Z3042 Encounter for surveillance of injectable contraceptive: Secondary | ICD-10-CM

## 2018-10-12 MED ORDER — MEDROXYPROGESTERONE ACETATE 150 MG/ML IM SUSP
150.0000 mg | Freq: Once | INTRAMUSCULAR | Status: AC
  Administered 2018-10-12: 09:00:00 150 mg via INTRAMUSCULAR

## 2018-10-12 NOTE — Progress Notes (Signed)
Pt here for depo which was given IM right glut.  NDC# [address]-1 

## 2019-01-04 ENCOUNTER — Ambulatory Visit (INDEPENDENT_AMBULATORY_CARE_PROVIDER_SITE_OTHER): Payer: Medicaid Other

## 2019-01-04 ENCOUNTER — Other Ambulatory Visit: Payer: Self-pay | Admitting: Advanced Practice Midwife

## 2019-01-04 ENCOUNTER — Ambulatory Visit: Payer: Medicaid Other

## 2019-01-04 ENCOUNTER — Other Ambulatory Visit: Payer: Self-pay

## 2019-01-04 DIAGNOSIS — Z3042 Encounter for surveillance of injectable contraceptive: Secondary | ICD-10-CM | POA: Diagnosis not present

## 2019-01-04 DIAGNOSIS — Z30013 Encounter for initial prescription of injectable contraceptive: Secondary | ICD-10-CM

## 2019-01-04 MED ORDER — MEDROXYPROGESTERONE ACETATE 150 MG/ML IM SUSP
150.0000 mg | Freq: Once | INTRAMUSCULAR | Status: AC
  Administered 2019-01-04: 10:00:00 150 mg via INTRAMUSCULAR

## 2019-01-17 ENCOUNTER — Ambulatory Visit (INDEPENDENT_AMBULATORY_CARE_PROVIDER_SITE_OTHER): Payer: Medicaid Other | Admitting: Advanced Practice Midwife

## 2019-01-17 ENCOUNTER — Other Ambulatory Visit: Payer: Self-pay

## 2019-01-17 ENCOUNTER — Encounter: Payer: Self-pay | Admitting: Advanced Practice Midwife

## 2019-01-17 VITALS — BP 114/70 | Ht 67.0 in | Wt 115.0 lb

## 2019-01-17 DIAGNOSIS — Z0000 Encounter for general adult medical examination without abnormal findings: Secondary | ICD-10-CM | POA: Diagnosis not present

## 2019-01-17 DIAGNOSIS — Z30013 Encounter for initial prescription of injectable contraceptive: Secondary | ICD-10-CM

## 2019-01-17 NOTE — Progress Notes (Signed)
Gynecology Annual Exam  PCP: Hubbard Hartshorn, FNP  Chief Complaint:  Chief Complaint  Patient presents with  . Annual Exam    History of Present Illness: Patient is a 23 y.o. G1P1001 presents for annual exam. The patient has no gyn complaints today. She reports she is doing well. Her baby is now walking.   LMP: Patient's last menstrual period was 01/10/2019. Menarche:15 She has moderate bleeding that lasts for 2 weeks approximately every 3 months Intermenstrual Bleeding: no Postcoital Bleeding: no Dysmenorrhea: no  The patient is sexually active. She currently uses Depo-Provera injections for contraception. She denies dyspareunia.  The patient does perform self breast exams.  There is no notable family history of breast or ovarian cancer in her family. Her maternal grandmother had breast cancer that was diagnosed when she was in her 66s or 41s.   The patient wears seatbelts: yes.  The patient has regular exercise: She is active with her 21 year old and at her job. She admits healthy lifestyle diet, hydration and sleep. She is currently vaping and using one disposable device every 2 weeks. She would like to quit.    The patient denies current symptoms of depression.    Review of Systems: Review of Systems  Constitutional: Negative.   HENT: Negative.   Eyes: Negative.   Respiratory: Negative.   Cardiovascular: Negative.   Gastrointestinal: Negative.   Genitourinary: Negative.   Musculoskeletal: Negative.   Skin: Negative.   Neurological: Negative.   Endo/Heme/Allergies: Negative.   Psychiatric/Behavioral: Negative.     Past Medical History:  Past Medical History:  Diagnosis Date  . Anxiety   . Asthma    pt states she has not taken medications for this since age 70  . Depression     Past Surgical History:  Past Surgical History:  Procedure Laterality Date  . WISDOM TOOTH EXTRACTION      Gynecologic History:  Patient's last menstrual period was 01/10/2019.  Contraception: Depo-Provera injections Last Pap: less than 17 yo  Obstetric History: G1P1001  Family History:  Family History  Adopted: Yes  Problem Relation Age of Onset  . Breast cancer Maternal Grandmother   . Diabetes Mellitus II Maternal Grandmother   . Depression Mother   . Depression Sister   . Depression Brother   . Anxiety disorder Brother   . Premature birth Sister   . Depression Sister     Social History:  Social History   Socioeconomic History  . Marital status: Single    Spouse name: Not on file  . Number of children: 1  . Years of education: Not on file  . Highest education level: 11th grade  Occupational History  . Occupation: Works at [employer]  . Financial resource strain: Not hard at all  . Food insecurity    Worry: Never true    Inability: Never true  . Transportation needs    Medical: No    Non-medical: No  Tobacco Use  . Smoking status: Current Every Day Smoker    Packs/day: 0.25    Years: 1.00    Pack years: 0.25    Types: E-cigarettes    Start date: 03/26/2017    Last attempt to quit: 04/04/2017    Years since quitting: 1.7  . Smokeless tobacco: Never Used  Substance and Sexual Activity  . Alcohol use: No  . Drug use: Yes    Types: Marijuana    Comment: had +MJ screen at [facility]  .  Sexual activity: Yes    Partners: Female    Birth control/protection: Injection  Lifestyle  . Physical activity    Days per week: 1 day    Minutes per session: 20 min  . Stress: Only a little  Relationships  . Social connections    Talks on phone: More than three times a week    Gets together: More than three times a week    Attends religious service: Never    Active member of club or organization: No    Attends meetings of clubs or organizations: Never    Relationship status: Never married  . Intimate partner violence    Fear of current or ex partner: No    Emotionally abused: No    Physically abused: No    Forced  sexual activity: No  Other Topics Concern  . Not on file  Social History Narrative   Lives with adoptive father and her boyfriend; baby boy is due 10/30/2017      Mom and older sister live in [place] and she does have periodic contact with them.    Allergies:  No Known Allergies  Medications: Prior to Admission medications   Medication Sig Start Date End Date Taking? Authorizing Provider  medroxyPROGESTERone (DEPO-PROVERA) 150 MG/ML injection INJECT 1 ML (150 MG TOTAL) INTO THE MUSCLE EVERY 3 (THREE) MONTHS. 01/04/19 12/30/19 Yes Tresea Mall, CNM  cephALEXin (KEFLEX) 500 MG capsule Take 1 capsule (500 mg total) by mouth 3 (three) times daily. Patient not taking: Reported on 04/27/2018 04/15/18   Phineas Semen, MD  phenazopyridine (PYRIDIUM) 200 MG tablet Take 1 tablet (200 mg total) by mouth 3 (three) times daily as needed for pain. Patient not taking: Reported on 04/27/2018 04/15/18 04/15/19  Phineas Semen, MD    Physical Exam Vitals: Blood pressure 114/70, height 5\' 7"  (1.702 m), weight 115 lb (52.2 kg), last menstrual period 01/10/2019  General: NAD HEENT: normocephalic, anicteric Thyroid: no enlargement, no palpable nodules Pulmonary: No increased work of breathing, CTAB Cardiovascular: RRR, distal pulses 2+ Breast: Breast symmetrical, no tenderness, no palpable nodules or masses, no skin or nipple retraction present, no nipple discharge. Nipple rings present. No axillary or supraclavicular lymphadenopathy. Abdomen: NABS, soft, non-tender, non-distended.  Umbilicus without lesions.  No hepatomegaly, splenomegaly or masses palpable. No evidence of hernia  Genitourinary: deferred for no concerns/no PAP Extremities: no edema, erythema, or tenderness Neurologic: Grossly intact Psychiatric: mood appropriate, affect full    Assessment: 23 y.o. G1P1001 routine annual exam  Plan: Problem List Items Addressed This Visit    None    Visit Diagnoses    Well woman exam without  gynecological exam    -  Primary      1) 4) Gardasil Series discussed and if applicable offered to patient - Patient has not previously completed 3 shot series   2) STI screening  was offered and declined  3)  ASCCP guidelines and rationale discussed.  Patient opts for begin screening at age 76 screening interval  4) Contraception - the patient is currently using  Depo-Provera injections.  She is happy with her current form of contraception and plans to continue We discussed safe sex practices to reduce her furture risk of STI's.    5) Return in about 1 year (around 01/17/2020) for annual established gyn.   01/19/2020, CNM [facility] 01/17/2019, 2:24 PM

## 2019-01-17 NOTE — Patient Instructions (Signed)
Electronic Cigarette Information  Electronic cigarettes, or e-cigarettes, are battery-operated devices that deliver nicotine-a very addictive drug-to the body. They come in many shapes, including in the shape of a cigarette, pipe, pen, and even a USB memory stick. E-cigarettes have a cartridge that contains a liquid form of nicotine. When a person uses the device, the liquid heats up. It then becomes a vapor. Inhaling this vapor is called vaping. Nicotine is thought to increase your risk for certain types of cancer. In addition to nicotine, e-cigarettes may contain other harmful and cancer-causing chemicals, including:  Formaldehyde.  Acetaldehyde.  Heavy metals.  Ultra-fine particles that can get inhaled deep into the lungs.  Chemical colorings and flavorings. It is not clear how much nicotine you get when vaping, and it is hard to know what chemicals are in the vaping liquids. The health effects of vaping are not completely known, but you should be aware of the possible dangers of using these products. Some people may use e-cigarettes in order to quit smoking tobacco. However, this has not been proven to work, and the Food and Drug Administration (FDA) has not approved e-cigarettes for this purpose. How can using electronic cigarettes affect me?  You may be at risk for developing a dangerous lung disease. There are reports of an increasing number of cases involving serious lung problems, and even death, associated with e-cigarette use. Your risk may be even higher if you: ? Buy e-cigarettes or vaping oils off the street. ? Add any substances to the e-cigarettes that are not intended by the manufacturer.  Vaping may make you crave nicotine. Nicotine does the following: ? Changes your blood sugar levels. ? Increases your heart rate, blood pressure, and breathing rate. ? Increases your risk of developing blood clots (hypercoagulable state) and diabetes. ? Increases your risk of gum disease  that may lead to losing teeth.  If you smoke e-cigarettes, you may be more likely to start smoking or to smoke more tobacco cigarettes.  Becoming addicted to nicotine may make your brain more sensitive to other addictive drugs. You may move to other addictive substances.  You may be in danger of overdosing on nicotine. Nicotine poisoning can cause nausea, vomiting, seizures, and trouble breathing.  An e-cigarette may explode and cause fires and burn injuries.  If you are pregnant, the nicotine in e-cigarettes may be harmful to your baby. Nicotine can cause: ? Brain or lung problems for your baby. ? Your baby to be born too early. ? Your baby to be born with a low birth weight.  Vaping has also been linked to decreases in memory and attention span in children and teens. What actions can I take to stop vaping? If you can, stop vaping on your own before you become addicted to nicotine. If you need help quitting, ask your health care provider. There are three effective ways to fight nicotine addiction:  Nicotine replacement therapy. Using nicotine gum or a nicotine patch blocks your craving for nicotine. Over time, you can reduce the amount of nicotine you use until you can stop using nicotine completely without having cravings.  Prescription medicines approved to fight nicotine addiction. These stop nicotine cravings or block the effects of nicotine.  Behavioral therapy. This may include: ? A self-help smoking cessation program. ? Individual or group therapy. ? A smoking cessation support group. There are several national programs to help you quit smoking or vaping. These include:  Text message programs, such as SmokefreeTXT.  Apps for mobile phones,   including the free quitSTART app.  Hotlines, such as 1-800-QUIT-NOW (1-800-784-8669). Where to find support You can get support at these sites:  U.S. Department of Health and Human Services: https://smokefree.gov  American Lung  Association: www.lung.org Where to find more information Learn more about e-cigarettes from:  National Institute on Drug Abuse: www.drugabuse.gov  Centers for Disease Control and Prevention: www.cdc.gov Summary  E-cigarettes can cause nicotine addiction.  E-cigarettes are not approved as a way to stop smoking. They are not a risk-free alternative to smoking tobacco.  There are reports of an increasing number of cases involving serious lung problems, and even death, associated with e-cigarette use.  If you can stop vaping on your own, do it before you become addicted to nicotine. If you need help quitting, ask your health care provider.  There are various methods and programs that can help you stop smoking or vaping. This information is not intended to replace advice given to you by your health care provider. Make sure you discuss any questions you have with your health care provider. Document Released: 07/06/2015 Document Revised: 07/10/2018 Document Reviewed: 05/25/2018 Elsevier Patient Education  2020 Elsevier Inc.  

## 2019-03-28 ENCOUNTER — Other Ambulatory Visit: Payer: Self-pay

## 2019-03-28 ENCOUNTER — Ambulatory Visit (INDEPENDENT_AMBULATORY_CARE_PROVIDER_SITE_OTHER): Payer: Medicaid Other

## 2019-03-28 DIAGNOSIS — Z3042 Encounter for surveillance of injectable contraceptive: Secondary | ICD-10-CM

## 2019-03-28 MED ORDER — MEDROXYPROGESTERONE ACETATE 150 MG/ML IM SUSP: 150.0000 mg | Freq: Once | INTRAMUSCULAR | Status: DC

## 2019-03-28 MED ORDER — MEDROXYPROGESTERONE ACETATE 150 MG/ML IM SUSP
150.0000 mg | Freq: Once | INTRAMUSCULAR | Status: AC
  Administered 2019-03-28: 10:00:00 150 mg via INTRAMUSCULAR

## 2019-03-28 NOTE — Progress Notes (Signed)
Pt here for depo which was given IM right glut.  NDC# [address]-1 

## 2019-03-28 NOTE — Addendum Note (Signed)
Addended by: Cleophas Dunker D on: 03/28/2019 10:24 AM   Modules accepted: Orders

## 2019-04-30 ENCOUNTER — Encounter: Payer: Medicaid Other | Admitting: Family Medicine

## 2019-05-14 ENCOUNTER — Encounter: Payer: Medicaid Other | Admitting: Family Medicine

## 2019-06-11 ENCOUNTER — Encounter: Payer: Medicaid Other | Admitting: Family Medicine

## 2019-06-20 ENCOUNTER — Ambulatory Visit: Payer: Medicaid Other

## 2019-07-01 ENCOUNTER — Ambulatory Visit: Payer: Medicaid Other

## 2019-09-26 DIAGNOSIS — Z419 Encounter for procedure for purposes other than remedying health state, unspecified: Secondary | ICD-10-CM | POA: Diagnosis not present

## 2019-10-24 ENCOUNTER — Ambulatory Visit
Admission: EM | Admit: 2019-10-24 | Discharge: 2019-10-24 | Disposition: A | Discharge status: Home / Self Care | Payer: Medicaid Other | Attending: Family Medicine | Admitting: Family Medicine

## 2019-10-24 ENCOUNTER — Telehealth: Payer: Self-pay | Admitting: Emergency Medicine

## 2019-10-24 ENCOUNTER — Other Ambulatory Visit: Payer: Self-pay

## 2019-10-24 DIAGNOSIS — B349 Viral infection, unspecified: Secondary | ICD-10-CM | POA: Diagnosis not present

## 2019-10-24 DIAGNOSIS — Z20822 Contact with and (suspected) exposure to covid-19: Secondary | ICD-10-CM | POA: Diagnosis not present

## 2019-10-24 DIAGNOSIS — F411 Generalized anxiety disorder: Secondary | ICD-10-CM | POA: Insufficient documentation

## 2019-10-24 DIAGNOSIS — F332 Major depressive disorder, recurrent severe without psychotic features: Secondary | ICD-10-CM | POA: Insufficient documentation

## 2019-10-24 DIAGNOSIS — J020 Streptococcal pharyngitis: Secondary | ICD-10-CM | POA: Diagnosis not present

## 2019-10-24 DIAGNOSIS — F17290 Nicotine dependence, other tobacco product, uncomplicated: Secondary | ICD-10-CM | POA: Diagnosis not present

## 2019-10-24 LAB — SARS CORONAVIRUS 2 (TAT 6-24 HRS): SARS Coronavirus 2: NEGATIVE

## 2019-10-24 LAB — GROUP A STREP BY PCR: Group A Strep by PCR: DETECTED — AB

## 2019-10-24 MED ORDER — AMOXICILLIN 875 MG PO TABS: 875.0000 mg | ORAL_TABLET | Freq: Two times a day (BID) | ORAL | 0 refills | Status: DC

## 2019-10-24 NOTE — Telephone Encounter (Signed)
Was able to successfully reach patient via phone and inform of positive strep.  Patient states she will go and pick up the amoxicillin.  Encourage supportive care.

## 2019-10-24 NOTE — ED Triage Notes (Signed)
Pt reports having body aches, sore throat, and runny nose x2 days. No other symptoms.

## 2019-10-24 NOTE — Discharge Instructions (Addendum)
Take medication as prescribed. Over-the-counter medication as discussed. Rest. Drink plenty of fluids.   Follow up with your primary care physician this week as needed. Return to Urgent care for new or worsening concerns.

## 2019-10-24 NOTE — ED Provider Notes (Addendum)
[facility] ____________________________________________  Time seen: Approximately 12:43 PM  I have reviewed the triage vital signs and the nursing notes.   HISTORY  Chief Complaint Generalized Body Aches   HPI Amy [name] is a 24 y.o. female present for evaluation of 2 days of runny nose, nasal congestion, scratchy sore throat.  States some coughing.  Denies fevers.  Continues to eat and drink well.  Denies nausea, vomiting, diarrhea, changes in taste or smell, chest pain, shortness of breath.  Did take some over-the-counter congestion medication without resolution.  States her roommate has a sinus infection.  Denies other known sick contacts.  Denies recent sickness.  Reports otherwise doing well.  Reports she has a history of frequent strep throat and would like to have strep swab to make sure no strep throat.  Doren Custard, FNP : PCP    Past Medical History:  Diagnosis Date  . Anxiety   . Asthma    pt states she has not taken medications for this since age 6  . Depression     Patient Active Problem List   Diagnosis Date Noted  . Indication for care in labor and delivery, antepartum 11/02/2017  . Normal labor 11/02/2017  . Supervision of high risk pregnancy, antepartum 07/26/2017  . Late prenatal care 07/26/2017  . 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 Eating disorder 08/21/2014  . Generalized anxiety disorder 08/21/2014    Past Surgical History:  Procedure Laterality Date  . WISDOM TOOTH EXTRACTION        Current Facility-Administered Medications:  .  medroxyPROGESTERone (DEPO-PROVERA) injection 150 mg, 150 mg, Intramuscular, Once, Vena Austria, MD  Current Outpatient Medications:  .  medroxyPROGESTERone (DEPO-PROVERA) 150 MG/ML injection, INJECT 1 ML (150 MG TOTAL) INTO THE MUSCLE EVERY 3 (THREE) MONTHS., Disp: 1 mL, Rfl: 3  Allergies Patient has no known allergies.  Family History   Adopted: Yes  Problem Relation Age of Onset  . Breast cancer Maternal Grandmother   . Diabetes Mellitus II Maternal Grandmother   . Depression Mother   . Depression Sister   . Depression Brother   . Anxiety disorder Brother   . Premature birth Sister   . Depression Sister     Social History Social History   Tobacco Use  . Smoking status: Current Every Day Smoker    Packs/day: 0.25    Years: 1.00    Pack years: 0.25    Types: E-cigarettes    Start date: 03/26/2017    Last attempt to quit: 04/04/2017    Years since quitting: 2.5  . Smokeless tobacco: Never Used  Vaping Use  . Vaping Use: Every day  Substance Use Topics  . Alcohol use: No  . Drug use: Yes    Types: Marijuana    Comment: had +MJ screen at [facility]    Review of Systems Constitutional: Negative for fevers. ENT: Positive sore throat.  Positive congestion. Cardiovascular: Denies chest pain. Respiratory: Denies shortness of breath. Gastrointestinal: No abdominal pain.  No nausea, no vomiting.  No diarrhea.   Musculoskeletal: Negative for back pain. Skin: Negative for rash.  ____________________________________________   PHYSICAL EXAM:  VITAL SIGNS: ED Triage Vitals  Enc Vitals Group     BP 10/24/19 1201 (!) 125/97     Pulse Rate 10/24/19 1201 73     Resp 10/24/19 1201 16     Temp 10/24/19 1201 98.4 F (36.9 C)     Temp Source 10/24/19 1201 Oral  SpO2 10/24/19 1201 99 %     Weight 10/24/19 1202 112 lb (50.8 kg)     Height 10/24/19 1202 5\' 7"  (1.702 m)     Head Circumference --      Peak Flow --      Pain Score 10/24/19 1202 0     Pain Loc --      Pain Edu? --      Excl. in GC? --     Constitutional: Alert and oriented. Well appearing and in no acute distress. Eyes: Conjunctivae are normal.  Head: Atraumatic. No sinus tenderness to palpation. No swelling. No erythema.  Ears: no erythema, normal TMs bilaterally.   Nose:Nasal congestion, clear rhinorrhea.  Mouth/Throat: Mucous membranes are  moist. Mild pharyngeal erythema. No tonsillar swelling or exudate.  Neck: No stridor. No cervical spine tenderness to palpation. Hematological/Lymphatic/Immunilogical: No cervical lymphadenopathy. Cardiovascular: Normal rate, regular rhythm. Grossly normal heart sounds.  Good peripheral circulation. Respiratory: Normal respiratory effort.  No retractions. No wheezes, rales or rhonchi. Good air movement.  Musculoskeletal: Ambulatory with steady gait.  Neurologic:  Normal speech and language. No gait instability. Skin:  Skin appears warm, dry and intact. No rash noted. Psychiatric: Mood and affect are normal. Speech and behavior are normal.  ___________________________________________   LABS (all labs ordered are listed, but only abnormal results are displayed)  Labs Reviewed  SARS CORONAVIRUS 2 (TAT 6-24 HRS)  GROUP A STREP BY PCR   ____________________________________________   PROCEDURES Procedures     INITIAL IMPRESSION / ASSESSMENT AND PLAN / ED COURSE  Pertinent labs & imaging results that were available during my care of the patient were reviewed by me and considered in my medical decision making (see chart for details).  Well-appearing patient.  No acute distress.  Suspect viral illness.  COVID-19 testing ordered. Strep testing. Awaiting results, will call. Encourage rest, fluids, supportive care, over-the-counter cough congestion medication as needed.  Discussed follow up with Primary care physician this week as needed. Discussed follow up and return parameters including no resolution or any worsening concerns. Patient verbalized understanding and agreed to plan.   Strep result returned positive. Amoxicillin sent to patient pharmacy. Called to attempt to reach patient, unsuccessful, will continue to call as well as alert nursing staff.  ____________________________________________   FINAL CLINICAL IMPRESSION(S) / ED DIAGNOSES  Final diagnoses:  Viral illness  Acute  pharyngitis, unspecified etiology     ED Discharge Orders    None       Note: This dictation was prepared with Dragon dictation along with smaller phrase technology. Any transcriptional errors that result from this process are unintentional.           10/26/19, NP 10/24/19 1339

## 2019-10-27 DIAGNOSIS — Z419 Encounter for procedure for purposes other than remedying health state, unspecified: Secondary | ICD-10-CM | POA: Diagnosis not present

## 2019-11-27 DIAGNOSIS — Z419 Encounter for procedure for purposes other than remedying health state, unspecified: Secondary | ICD-10-CM | POA: Diagnosis not present

## 2019-12-18 ENCOUNTER — Emergency Department
Admission: EM | Admit: 2019-12-18 | Discharge: 2019-12-18 | Disposition: A | Discharge status: Home / Self Care | Payer: Medicaid Other | Attending: Emergency Medicine | Admitting: Emergency Medicine

## 2019-12-18 ENCOUNTER — Other Ambulatory Visit: Payer: Self-pay

## 2019-12-18 DIAGNOSIS — F329 Major depressive disorder, single episode, unspecified: Secondary | ICD-10-CM | POA: Diagnosis not present

## 2019-12-18 DIAGNOSIS — Z5321 Procedure and treatment not carried out due to patient leaving prior to being seen by health care provider: Secondary | ICD-10-CM | POA: Diagnosis not present

## 2019-12-18 LAB — URINE DRUG SCREEN, QUALITATIVE (ARMC ONLY)
Amphetamines, Ur Screen: NOT DETECTED
Barbiturates, Ur Screen: NOT DETECTED
Benzodiazepine, Ur Scrn: POSITIVE — AB
Cannabinoid 50 Ng, Ur ~~LOC~~: POSITIVE — AB
Cocaine Metabolite,Ur ~~LOC~~: NOT DETECTED
MDMA (Ecstasy)Ur Screen: NOT DETECTED
Methadone Scn, Ur: NOT DETECTED
Opiate, Ur Screen: NOT DETECTED
Phencyclidine (PCP) Ur S: NOT DETECTED
Tricyclic, Ur Screen: NOT DETECTED

## 2019-12-18 LAB — CBC
HCT: 39.4 % (ref 36.0–46.0)
Hemoglobin: 12.8 g/dL (ref 12.0–15.0)
MCH: 28.6 pg (ref 26.0–34.0)
MCHC: 32.5 g/dL (ref 30.0–36.0)
MCV: 88.1 fL (ref 80.0–100.0)
Platelets: 305 10*3/uL (ref 150–400)
RBC: 4.47 MIL/uL (ref 3.87–5.11)
RDW: 12.8 % (ref 11.5–15.5)
WBC: 5.9 10*3/uL (ref 4.0–10.5)
nRBC: 0 % (ref 0.0–0.2)

## 2019-12-18 LAB — COMPREHENSIVE METABOLIC PANEL
ALT: 9 U/L (ref 0–44)
AST: 12 U/L — ABNORMAL LOW (ref 15–41)
Albumin: 4.4 g/dL (ref 3.5–5.0)
Alkaline Phosphatase: 66 U/L (ref 38–126)
Anion gap: 9 (ref 5–15)
BUN: 10 mg/dL (ref 6–20)
CO2: 26 mmol/L (ref 22–32)
Calcium: 9.3 mg/dL (ref 8.9–10.3)
Chloride: 103 mmol/L (ref 98–111)
Creatinine, Ser: 0.78 mg/dL (ref 0.44–1.00)
GFR calc Af Amer: >60 mL/min (ref >60)
GFR calc non Af Amer: >60 mL/min (ref >60)
Glucose, Bld: 104 mg/dL — ABNORMAL HIGH (ref 70–99)
Potassium: 3.7 mmol/L (ref 3.5–5.1)
Sodium: 138 mmol/L (ref 135–145)
Total Bilirubin: 0.7 mg/dL (ref 0.3–1.2)
Total Protein: 7.5 g/dL (ref 6.5–8.1)

## 2019-12-18 LAB — POCT PREGNANCY, URINE: Preg Test, Ur: NEGATIVE

## 2019-12-18 LAB — ETHANOL: Alcohol, Ethyl (B): <10 mg/dL (ref <10)

## 2019-12-18 LAB — ACETAMINOPHEN LEVEL: Acetaminophen (Tylenol), Serum: <10 ug/mL — ABNORMAL LOW (ref 10–30)

## 2019-12-18 LAB — SALICYLATE LEVEL: Salicylate Lvl: <7 mg/dL — ABNORMAL LOW (ref 7.0–30.0)

## 2019-12-18 NOTE — ED Notes (Signed)
Patient left without being seen.  Reports has been in ed to long with no md to see her. Got up and left. Patient voluntary, stated she wil make appoint to her her doctor.

## 2019-12-18 NOTE — ED Triage Notes (Signed)
Pt arrives via pov from work. Pt states she would like to speak to someone regarding her depression and possible medication options. Pt denies SI, or HI. NAD noted. Pt states she feels like depression has worsened over last couple. Reports her ex is moving and thinks this could possibly be a trigger. Pt hx of depression and took medication for around 1 year at age 24 but felt it wasn't helping. Pt reports therapist helped more than medication previously, but has seen one since she was 19. NAD noted at this time.

## 2019-12-19 ENCOUNTER — Telehealth: Payer: Self-pay | Admitting: *Deleted

## 2019-12-19 NOTE — Telephone Encounter (Signed)
Amy [name] presented to the ED and left before being seen by the provider on 12/19/2019. The patient has been enrolled in an automated general discharge outreach program and 2 attempts to contact the patient will be made to follow up on their ED visit and subsequent needs. The care management team is available to provide assistance to this patient at any time.   Jaynee Eagles, CMA [facility]) [place]   [facility]

## 2019-12-27 DIAGNOSIS — Z419 Encounter for procedure for purposes other than remedying health state, unspecified: Secondary | ICD-10-CM | POA: Diagnosis not present

## 2020-01-27 DIAGNOSIS — Z419 Encounter for procedure for purposes other than remedying health state, unspecified: Secondary | ICD-10-CM | POA: Diagnosis not present

## 2020-02-26 DIAGNOSIS — Z419 Encounter for procedure for purposes other than remedying health state, unspecified: Secondary | ICD-10-CM | POA: Diagnosis not present

## 2020-03-28 DIAGNOSIS — Z419 Encounter for procedure for purposes other than remedying health state, unspecified: Secondary | ICD-10-CM | POA: Diagnosis not present

## 2020-04-27 IMAGING — US US MFM OB DETAIL+14 WK
1 series · 12 of 28 positions shown · non-contrast
Comparison: none

PATIENT INFO:

GREEN
PERFORMED BY:
SERVICE(S) PROVIDED:
INDICATIONS:
32 weeks gestation of pregnancy
Teen pregnancy
Late prenatal care
Unable to complete fetal anatomy at
referring practice
FETAL EVALUATION:
Num Of Fetuses:     1
Fetal Heart         158
Rate(bpm):
Cardiac Activity:   Present
Presentation:       Vertex
Placenta:           Posterior Grade 3, No previa
P. Cord Insertion:  Normal
Amniotic Fluid
AFI FV:      Within normal limits
AFI Sum(cm)     %Tile       Largest Pocket(cm)
12.1            32
BIOMETRY:
BPD:      78.5  mm     G. Age:  31w 4d         26  %    CI:        71.22   %    70 - 86
FL/HC:      21.5   %    19.1 -
HC:      296.3  mm     G. Age:  32w 5d         33  %    HC/AC:      1.13        0.96 -
AC:      262.4  mm     G. Age:  30w 2d         10  %    FL/BPD:     81.1   %    71 - 87
FL:       63.7  mm     G. Age:  33w 0d         63  %    FL/AC:      24.3   %    20 - 24
HUM:      55.4  mm     G. Age:  32w 2d         55  %
Est. FW:    9949  gm    3 lb 15 oz      29  %
GESTATIONAL AGE:
U/S Today:     31w 6d                                        EDD:   10/31/17
Best:          32w 0d     Det. By:  Early Ultrasound         EDD:   10/30/17
(07/26/17)
ANATOMY:
Cranium:               Normal appearance      Ductal Arch:            Normal appearance
Cavum:                 Normal appearance      Diaphragm:              Within Normal Limits
Ventricles:            Normal appearance      Stomach:                Seen
Choroid Plexus:        Within Normal Limits   Abdomen:                Normal appearance
Posterior Fossa:       Within Normal Limits   Abdominal Wall:         Normal appearance
Face:                  Within Normal Limits   Cord Vessels:           Within Normal
Limits, 3 vessels
Lips:                  Normal appearance      Kidneys:                Normal appearance
Thoracic:              Normal appearance      Bladder:                Seen
Heart:                 Normal appearance      Spine:                  Normal appearance
RVOT:                  Normal appearance      Upper Extremities:      Within Normal Limits
LVOT:                  Normal appearance      Lower Extremities:      Within Normal Limits
Aortic Arch:           Normal appearance
Other:  Ao/Pa and profile appear normal; bicaval and 3VT inadequately seen
CERVIX UTERUS ADNEXA:
Adnexa:       Normal appearance

[Series 1: us mfm ob detail+14 wk · 0.25mm/px · 12 of 81 slices shown]
[im 3/81]
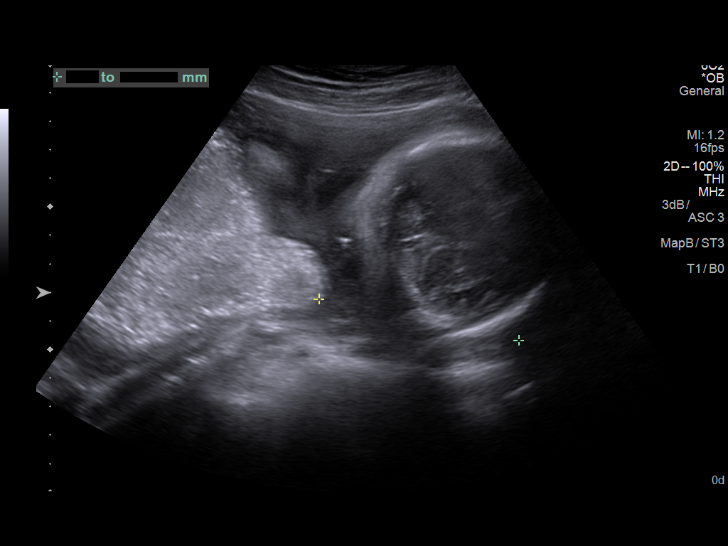
[im 9/81]
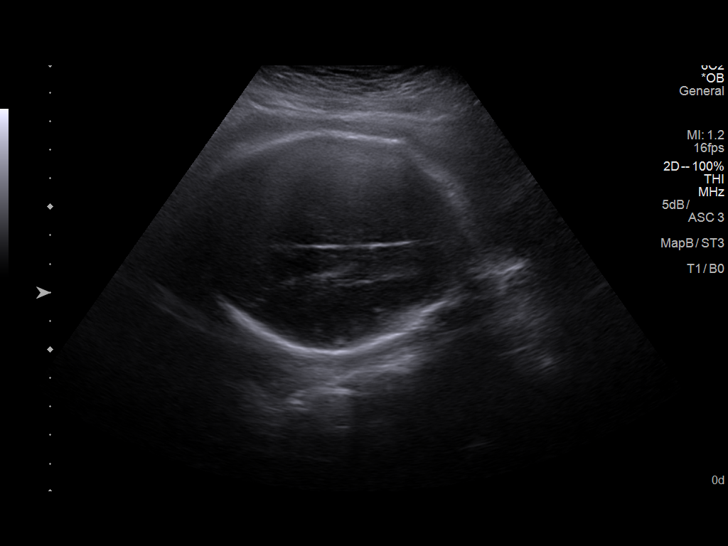
[im 15/81]
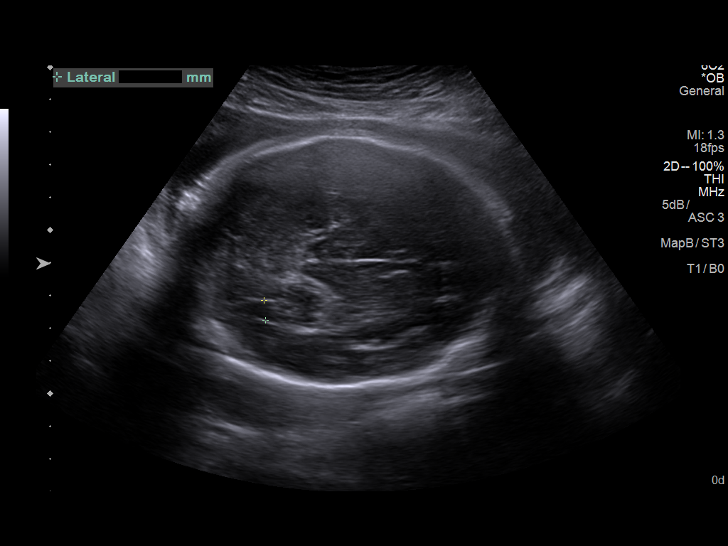
[im 24/81]
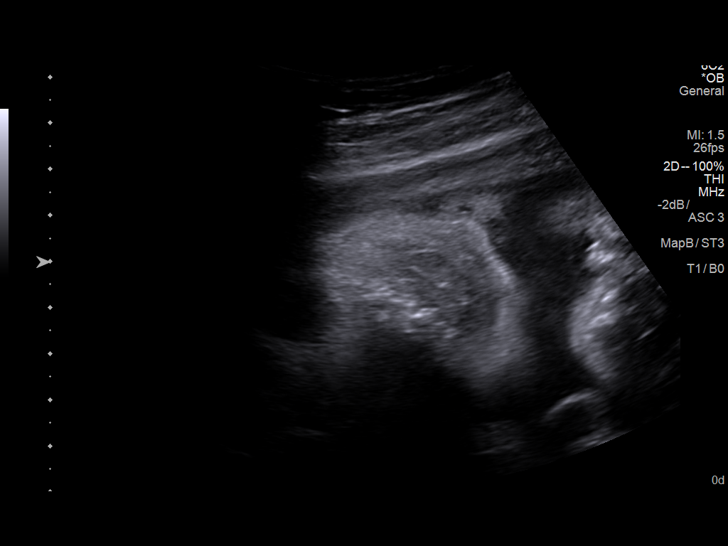
[im 30/81]
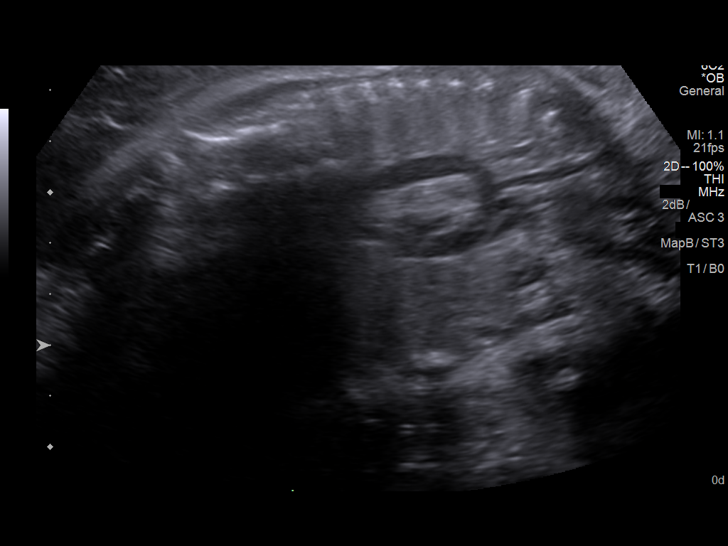
[im 36/81]
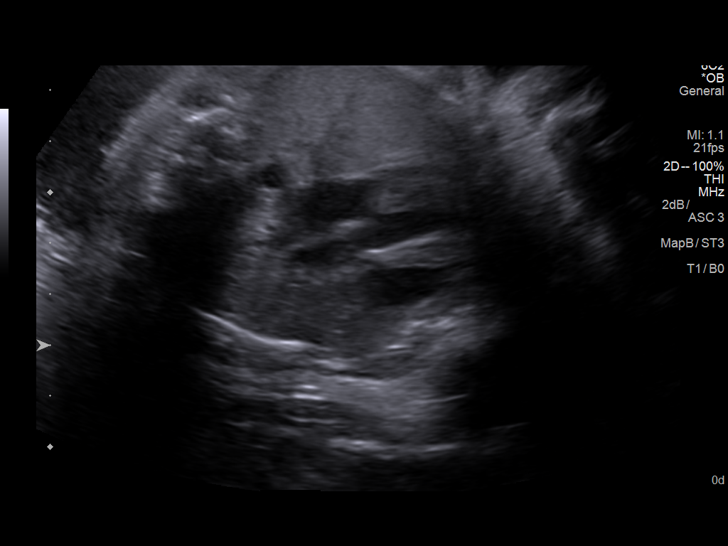
[im 45/81]
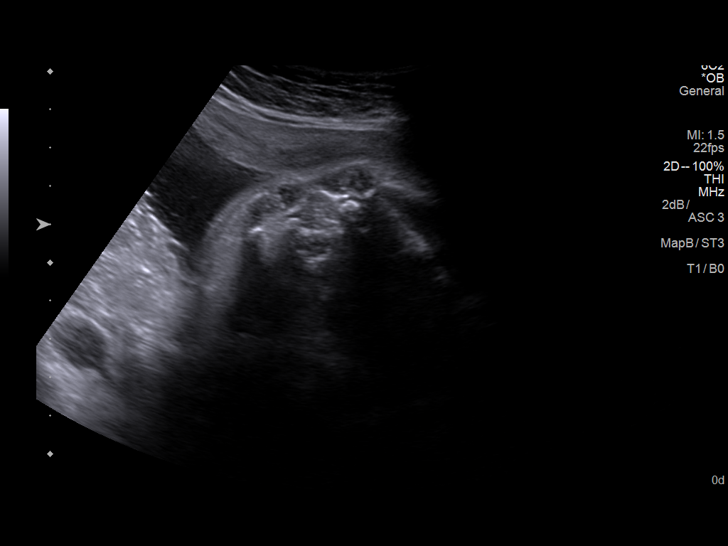
[im 51/81]
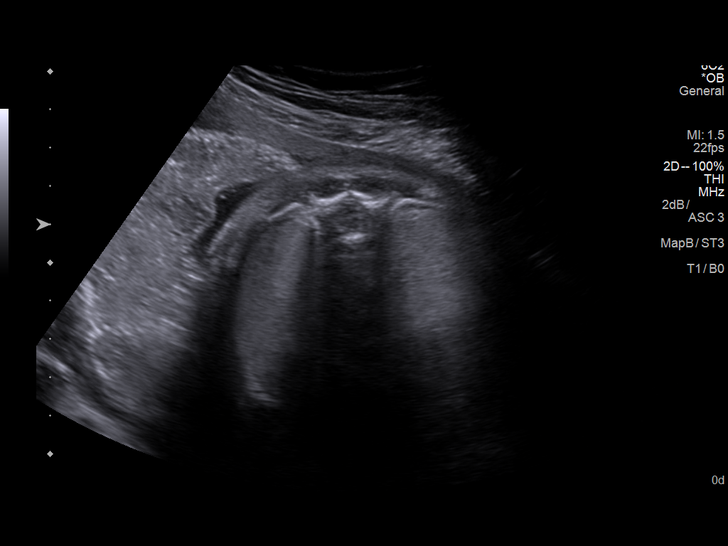
[im 57/81]
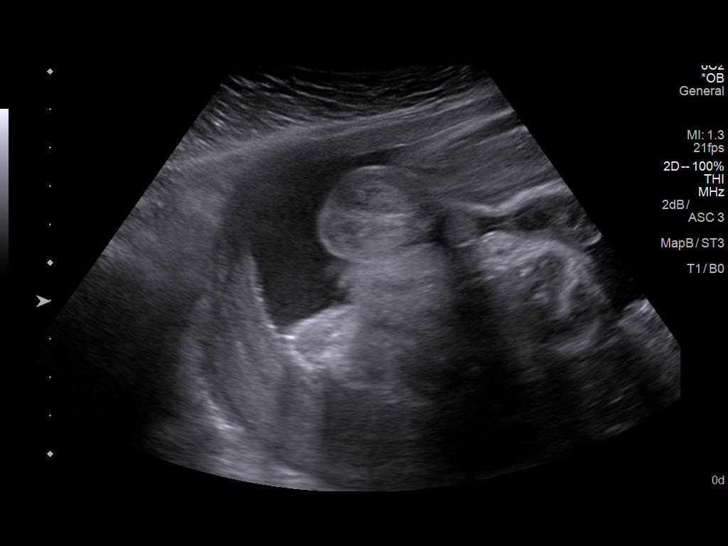
[im 66/81]
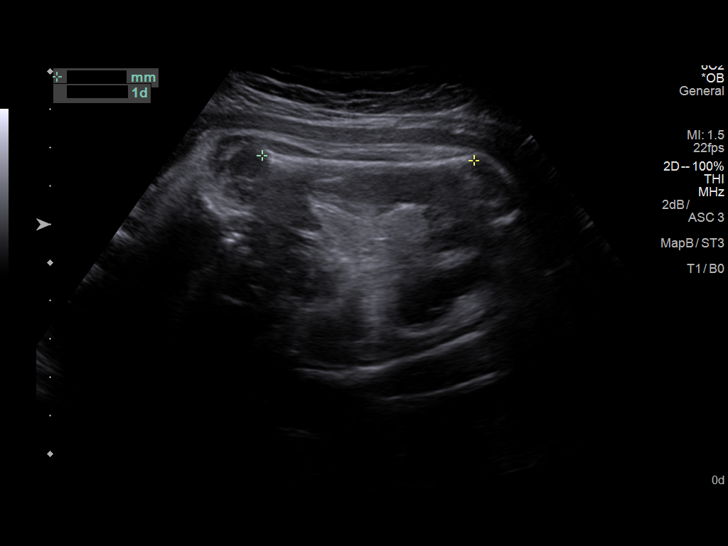
[im 72/81]
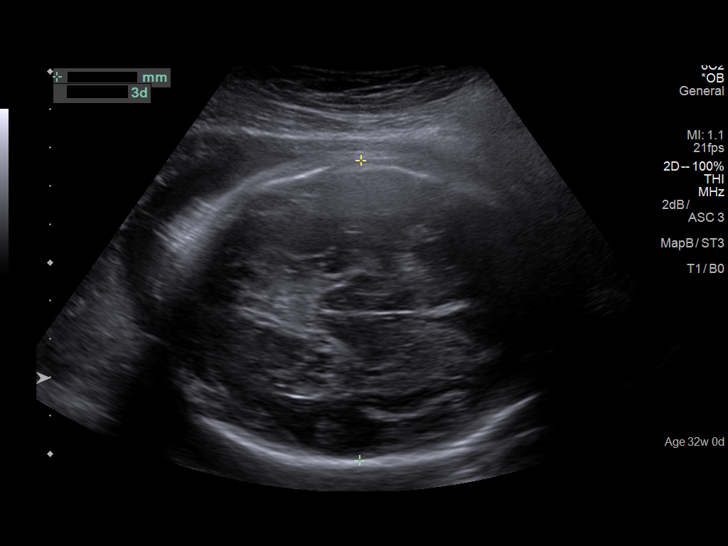
[im 78/81]
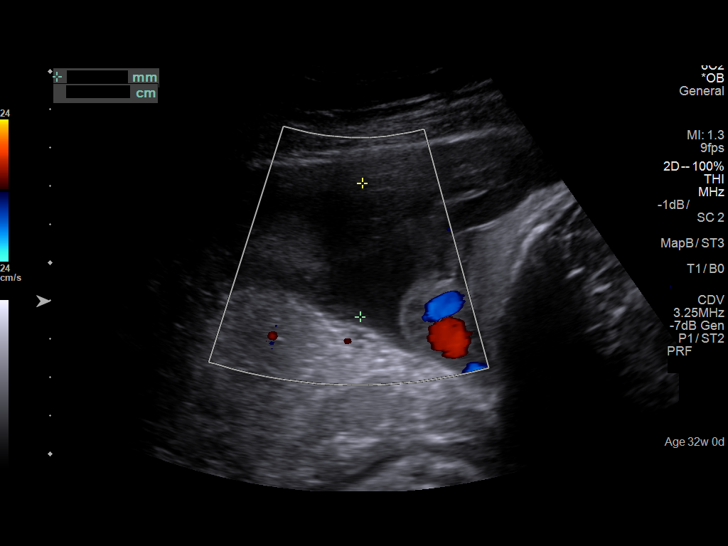

[12 of 28 positions shown; findings below may reference images not displayed]

IMPRESSION: Intrauterine pregnancy with a best estimated gestational age
of 32 weeks 0 days.  Dating is based on earliest available
ultrasound performed at [facility] on 07/26/2017;
measurements were reported as 26 weeks 2 days.

Due to fetal position and late gestational age, bicaval and 3
vessel trachea views of the heart were inadequate.  Other
fetal anatomy visualized appears normal, including the profile.

Overall growth is appropriate; note that AC is measuring at
the 10th percentile.

Findings were discussed.  Consider follow up ultrasound for
growth in 3-4 weeks.

## 2020-04-28 DIAGNOSIS — Z419 Encounter for procedure for purposes other than remedying health state, unspecified: Secondary | ICD-10-CM | POA: Diagnosis not present

## 2020-05-26 DIAGNOSIS — Z419 Encounter for procedure for purposes other than remedying health state, unspecified: Secondary | ICD-10-CM | POA: Diagnosis not present

## 2020-06-26 DIAGNOSIS — Z419 Encounter for procedure for purposes other than remedying health state, unspecified: Secondary | ICD-10-CM | POA: Diagnosis not present

## 2020-07-26 DIAGNOSIS — Z419 Encounter for procedure for purposes other than remedying health state, unspecified: Secondary | ICD-10-CM | POA: Diagnosis not present

## 2020-08-26 DIAGNOSIS — Z419 Encounter for procedure for purposes other than remedying health state, unspecified: Secondary | ICD-10-CM | POA: Diagnosis not present

## 2020-09-25 DIAGNOSIS — Z419 Encounter for procedure for purposes other than remedying health state, unspecified: Secondary | ICD-10-CM | POA: Diagnosis not present

## 2020-10-26 DIAGNOSIS — Z419 Encounter for procedure for purposes other than remedying health state, unspecified: Secondary | ICD-10-CM | POA: Diagnosis not present

## 2020-11-26 DIAGNOSIS — Z419 Encounter for procedure for purposes other than remedying health state, unspecified: Secondary | ICD-10-CM | POA: Diagnosis not present

## 2020-12-26 DIAGNOSIS — Z419 Encounter for procedure for purposes other than remedying health state, unspecified: Secondary | ICD-10-CM | POA: Diagnosis not present

## 2021-01-26 DIAGNOSIS — Z419 Encounter for procedure for purposes other than remedying health state, unspecified: Secondary | ICD-10-CM | POA: Diagnosis not present

## 2021-01-29 ENCOUNTER — Ambulatory Visit (INDEPENDENT_AMBULATORY_CARE_PROVIDER_SITE_OTHER): Payer: Medicaid Other | Admitting: Obstetrics and Gynecology

## 2021-01-29 ENCOUNTER — Other Ambulatory Visit (HOSPITAL_COMMUNITY)
Admission: RE | Admit: 2021-01-29 | Discharge: 2021-01-29 | Disposition: A | Discharge status: Home / Self Care | Payer: Medicaid Other | Source: Ambulatory Visit | Attending: Obstetrics and Gynecology | Admitting: Obstetrics and Gynecology

## 2021-01-29 ENCOUNTER — Encounter: Payer: Self-pay | Admitting: Obstetrics and Gynecology

## 2021-01-29 ENCOUNTER — Other Ambulatory Visit: Payer: Self-pay

## 2021-01-29 VITALS — BP 100/70 | Ht 67.0 in | Wt 116.4 lb

## 2021-01-29 DIAGNOSIS — Z124 Encounter for screening for malignant neoplasm of cervix: Secondary | ICD-10-CM | POA: Diagnosis not present

## 2021-01-29 DIAGNOSIS — Z1371 Encounter for nonprocreative screening for genetic disease carrier status: Secondary | ICD-10-CM

## 2021-01-29 DIAGNOSIS — N926 Irregular menstruation, unspecified: Secondary | ICD-10-CM

## 2021-01-29 DIAGNOSIS — Z3480 Encounter for supervision of other normal pregnancy, unspecified trimester: Secondary | ICD-10-CM | POA: Insufficient documentation

## 2021-01-29 LAB — POCT URINE PREGNANCY: Preg Test, Ur: POSITIVE — AB

## 2021-01-29 LAB — POCT URINALYSIS DIPSTICK OB
Glucose, UA: NEGATIVE
POC,PROTEIN,UA: NEGATIVE

## 2021-01-29 NOTE — Patient Instructions (Addendum)
First Trimester of Pregnancy The first trimester of pregnancy starts on the first day of your last menstrual period until the end of week 12. This is months 1 through 3 of pregnancy. A week after a sperm fertilizes an egg, the egg will implant into the wall of the uterus and begin to develop into a baby. By the end of 12 weeks, all the baby's organs will be formed and the baby will be 2-3 inches in size. Body changes during your first trimester Your body goes through many changes during pregnancy. The changes vary and generally return to normal after your baby is born. Physical changes You may gain or lose weight. Your breasts may begin to grow larger and become tender. The tissue that surrounds your nipples (areola) may become darker. Dark spots or blotches (chloasma or mask of pregnancy) may develop on your face. You may have changes in your hair. These can include thickening or thinning of your hair or changes in texture. Health changes You may feel nauseous, and you may vomit. You may have heartburn. You may develop headaches. You may develop constipation. Your gums may bleed and may be sensitive to brushing and flossing. Other changes You may tire easily. You may urinate more often. Your menstrual periods will stop. You may have a loss of appetite. You may develop cravings for certain kinds of food. You may have changes in your emotions from day to day. You may have more vivid and strange dreams. Follow these instructions at home: Medicines Follow your health care provider's instructions regarding medicine use. Specific medicines may be either safe or unsafe to take during pregnancy. Do not take any medicines unless told to by your health care provider. Take a prenatal vitamin that contains at least 600 micrograms (mcg) of folic acid. Eating and drinking Eat a healthy diet that includes fresh fruits and vegetables, whole grains, good sources of protein such as meat, eggs, or tofu,  and low-fat dairy products. Avoid raw meat and unpasteurized juice, milk, and cheese. These carry germs that can harm you and your baby. If you feel nauseous or you vomit: Eat 4 or 5 small meals a day instead of 3 large meals. Try eating a few soda crackers. Drink liquids between meals instead of during meals. You may need to take these actions to prevent or treat constipation: Drink enough fluid to keep your urine pale yellow. Eat foods that are high in fiber, such as beans, whole grains, and fresh fruits and vegetables. Limit foods that are high in fat and processed sugars, such as fried or sweet foods. Activity Exercise only as directed by your health care provider. Most people can continue their usual exercise routine during pregnancy. Try to exercise for 30 minutes at least 5 days a week. Stop exercising if you develop pain or cramping in the lower abdomen or lower back. Avoid exercising if it is very hot or humid or if you are at high altitude. Avoid heavy lifting. If you choose to, you may have sex unless your health care provider tells you not to. Relieving pain and discomfort Wear a good support bra to relieve breast tenderness. Rest with your legs elevated if you have leg cramps or low back pain. If you develop bulging veins (varicose veins) in your legs: Wear support hose as told by your health care provider. Elevate your feet for 15 minutes, 3-4 times a day. Limit salt in your diet. Safety Wear your seat belt at all times when driving  or riding in a car. Talk with your health care provider if someone is verbally or physically abusive to you. Talk with your health care provider if you are feeling sad or have thoughts of hurting yourself. Lifestyle Do not use hot tubs, steam rooms, or saunas. Do not douche. Do not use tampons or scented sanitary pads. Do not use herbal remedies, alcohol, illegal drugs, or medicines that are not approved by your health care provider. Chemicals  in these products can harm your baby. Do not use any products that contain nicotine or tobacco, such as cigarettes, e-cigarettes, and chewing tobacco. If you need help quitting, ask your health care provider. Avoid cat litter boxes and soil used by cats. These carry germs that can cause birth defects in the baby and possibly loss of the unborn baby (fetus) by miscarriage or stillbirth. General instructions During routine prenatal visits in the first trimester, your health care provider will do a physical exam, perform necessary tests, and ask you how things are going. Keep all follow-up visits. This is important. Ask for help if you have counseling or nutritional needs during pregnancy. Your health care provider can offer advice or refer you to specialists for help with various needs. Schedule a dentist appointment. At home, brush your teeth with a soft toothbrush. Floss gently. Write down your questions. Take them to your prenatal visits. Where to find more information American Pregnancy Association: americanpregnancy.org [employer]: https://www.todd-brady.net/ Office on [employer] Health: MightyReward.co.nz Contact a health care provider if you have: Dizziness. A fever. Mild pelvic cramps, pelvic pressure, or nagging pain in the abdominal area. Nausea, vomiting, or diarrhea that lasts for 24 hours or longer. A bad-smelling vaginal discharge. Pain when you urinate. Known exposure to a contagious illness, such as chickenpox, measles, Zika virus, HIV, or hepatitis. Get help right away if you have: Spotting or bleeding from your vagina. Severe abdominal cramping or pain. Shortness of breath or chest pain. Any kind of trauma, such as from a fall or a car crash. New or increased pain, swelling, or redness in an arm or leg. Summary The first trimester of pregnancy starts on the first day of your last menstrual period until the end of week  12 (months 1 through 3). Eating 4 or 5 small meals a day rather than 3 large meals may help to relieve nausea and vomiting. Do not use any products that contain nicotine or tobacco, such as cigarettes, e-cigarettes, and chewing tobacco. If you need help quitting, ask your health care provider. Keep all follow-up visits. This is important. This information is not intended to replace advice given to you by your health care provider. Make sure you discuss any questions you have with your health care provider. Document Revised: 08/21/2019 Document Reviewed: 06/27/2019 Elsevier Patient Education  2022 [employer]. Hyperemesis Gravidarum Hyperemesis gravidarum is a severe form of nausea and vomiting that happens during pregnancy. Hyperemesis is worse than morning sickness. It may cause you to have nausea or vomiting all day for many days. It may keep you from eating and drinking enough food and liquids, which can lead to dehydration, malnutrition, and weight loss. Hyperemesis usually occurs during the first half (the first 20 weeks) of pregnancy. It often goes away once a woman is in her second half of pregnancy. However, sometimes hyperemesis continues through an entire pregnancy. What are the causes? The cause of this condition is not known. It may be associated with: Changes in hormones in the body  during pregnancy. Changes in the gastrointestinal system. Genetic or inherited conditions. What are the signs or symptoms? Symptoms of this condition include: Severe nausea and vomiting that does not go away. Problems keeping food down. Weight loss. Loss of body fluid (dehydration). Loss of appetite. You may have no desire to eat or you may not like the food you have previously enjoyed. How is this diagnosed? This condition may be diagnosed based on your medical history, your symptoms, and a physical exam. You may also have other tests, including: Blood tests. Urine tests. Blood pressure  tests. Ultrasound to look for problems with the placenta or to check if you are pregnant with more than one baby. How is this treated? This condition is managed by controlling symptoms. This may include: Following an eating plan. This can help to lessen nausea and vomiting. Treatments that do not use medicine. These include acupressure bracelets, hypnosis, and eating or drinking foods or fluids that contain ginger, ginger ale, or ginger tea. Taking prescription medicine or over-the-counter medicine as told by your health care provider. Continuing to take prenatal vitamins. You may need to change what kind you take and when you take them. Follow your health care provider's instructions about prenatal vitamins. An eating plan and medicines are often used together to help control symptoms. If medicines do not help relieve nausea and vomiting, you may need to receive fluids through an IV at the hospital. Follow these instructions at home: To help relieve your symptoms, listen to your body. Everyone is different and has different preferences. Find what works best for you. Here are some things you can try to help relieve your symptoms: Meals and snacks  Eat 5-6 small meals daily instead of 3 large meals. Eating small meals and snacks can help you avoid an empty stomach. Before getting out of bed, eat a couple of crackers to avoid moving around on an empty stomach. Eat a protein-rich snack before bed. Examples include cheese and crackers, or a peanut butter sandwich made with 1 slice of whole-wheat bread and 1 tsp (5 g) of peanut butter. Eat and drink slowly. Try eating starchy foods as these are usually tolerated well. Examples include cereal, toast, bread, potatoes, pasta, rice, and pretzels. Eat at least one serving of protein with your meals and snacks. Protein options include lean meats, poultry, seafood, beans, nuts, nut butters, eggs, cheese, and yogurt. Eat or suck on things that have ginger in  them. It may help to relieve nausea. Add  tsp (0.44 g) ground ginger to hot tea, or choose ginger tea. Fluids It is important to stay hydrated. Try to: Drink small amounts of fluids often. Drink fluids 30 minutes before or after a meal to help lessen the feeling of a full stomach. Drink 100% fruit juice or an electrolyte drink. An electrolyte drink contains sodium, potassium, and chloride. Drink fluids that are cold, clear, and carbonated or sour. These include lemonade, ginger ale, lemon-lime soda, ice water, and sparkling water. Things to avoid Avoid the following: Eating foods that trigger your symptoms. These may include spicy foods, coffee, high-fat foods, very sweet foods, and acidic foods. Drinking more than 1 cup of fluid at a time. Skipping meals. Nausea can be more intense on an empty stomach. If you cannot tolerate food, do not force it. Try sucking on ice chips or other frozen items and make up for missed calories later. Lying down within 2 hours after eating. Being exposed to environmental triggers. These may include food  smells, smoky rooms, closed spaces, rooms with strong smells, warm or humid places, overly loud and noisy rooms, and rooms with motion or flickering lights. Try eating meals in a well-ventilated area that is free of strong smells. Making quick and sudden changes in your movement. Taking iron pills and multivitamins that contain iron. If you take prescription iron pills, do not stop taking them unless your health care provider approves. Preparing food. The smell of food can spoil your appetite or trigger nausea. General instructions Brush your teeth or use a mouth rinse after meals. Take over-the-counter and prescription medicines only as told by your health care provider. Follow instructions from your health care provider about eating or drinking restrictions. Talk with your health care provider about starting a supplement of vitamin B6. Continue to take your  prenatal vitamins as told by your health care provider. If you are having trouble taking your prenatal vitamins, talk with your health care provider about other options. Keep all follow-up visits. This is important. Follow-up visits include prenatal visits. Contact a health care provider if: You have pain in your abdomen. You have a severe headache. You have vision problems. You are losing weight. You feel weak or dizzy. You cannot eat or drink without vomiting, especially if this goes on for a full day. Get help right away if: You cannot drink fluids without vomiting. You vomit blood. You have constant nausea and vomiting. You are very weak. You faint. You have a fever and your symptoms suddenly get worse. Summary Hyperemesis gravidarum is a severe form of nausea and vomiting that happens during pregnancy. Making some changes to your eating habits may help relieve nausea and vomiting. This condition may be managed with lifestyle changes and medicines as prescribed by your health care provider. If medicines do not help relieve nausea and vomiting, you may need to receive fluids through an IV at the hospital. This information is not intended to replace advice given to you by your health care provider. Make sure you discuss any questions you have with your health care provider. Document Revised: 10/07/2019 Document Reviewed: 10/07/2019 Elsevier Patient Education  2022 Elsevier Inc. Initial steps to help :   B6 (pyridoxine) 25 mg,  3-4 times a day- 200 mg a day total Unisom (doxylamine) 25 mg at bedtime **B6 and Unisom are available as a combination prescription medications called diclegis and bonjesta  B1 (thiamin)  50-100 mg 1-2 a day-  100 mg a day total  Continue prenatal vitamin with iron and thiamin. If it is not tolerated switch to 1 mg of folic acid.  Can add medication for gastric reflux if needed.  Subsequent steps to be added to B1, B6, and Unisom:  Antihistamine (one of  the following medications) Dramamine      25-50 mg every 4-6 hours Benadryl      25-50 mg every 4-6 hours Meclizine      25 mg every 6 hours  2. Dopamine Antagonist (one of the following medications) Metoclopramide  (Reglan)  5-10 mg every 6-8 hours         PO Promethazine   (Phenergan)   12.5-25 mg every 4-6 hours      PO or rectal Prochlorperazine  (Compazine)  5-10 mg every 6-8 hours     25mg  BID rectally   Subsequent steps if there has still not been improvement in symptoms:  3. Daily stool softner:  Colace 100 mg twice a day  4. Ondansetron  (Zofran)   4-8  mg every 6-8 hours  Managing the Challenge of Quitting Smoking Quitting smoking is a physical and mental challenge. You will face cravings, withdrawal symptoms, and temptation. Before quitting, work with your health care provider to make a plan that can help you manage quitting. Preparation can help you quit and keep you from giving in. How to manage lifestyle changes Managing stress Stress can make you want to smoke, and wanting to smoke may cause stress. It is important to find ways to manage your stress. You might try some of the following: Practice relaxation techniques. Breathe slowly and deeply, in through your nose and out through your mouth. Listen to music. Soak in a bath or take a shower. Imagine a peaceful place or vacation. Get some support. Talk with family or friends about your stress. Join a support group. Talk with a counselor or therapist. Get some physical activity. Go for a walk, run, or bike ride. Play a favorite sport. Practice yoga.  Medicines Talk with your health care provider about medicines that might help you deal with cravings and make quitting easier for you. Relationships Social situations can be difficult when you are quitting smoking. To manage this, you can: Avoid parties and other social situations where people might be smoking. Avoid alcohol. Leave right away if you have the urge to  smoke. Explain to your family and friends that you are quitting smoking. Ask for support and let them know you might be a bit grumpy. Plan activities where smoking is not an option. General instructions Be aware that many people gain weight after they quit smoking. However, not everyone does. To keep from gaining weight, have a plan in place before you quit and stick to the plan after you quit. Your plan should include: Having healthy snacks. When you have a craving, it may help to: Eat popcorn, carrots, celery, or other cut vegetables. Chew sugar-free gum. Changing how you eat. Eat small portion sizes at meals. Eat 4-6 small meals throughout the day instead of 1-2 large meals a day. Be mindful when you eat. Do not watch television or do other things that might distract you as you eat. Exercising regularly. Make time to exercise each day. If you do not have time for a long workout, do short bouts of exercise for 5-10 minutes several times a day. Do some form of strengthening exercise, such as weight lifting. Do some exercise that gets your heart beating and causes you to breathe deeply, such as walking fast, running, swimming, or biking. This is very important. Drinking plenty of water or other low-calorie or no-calorie drinks. Drink 6-8 glasses of water daily.  How to recognize withdrawal symptoms Your body and mind may experience discomfort as you try to get used to not having nicotine in your system. These effects are called withdrawal symptoms. They may include: Feeling hungrier than normal. Having trouble concentrating. Feeling irritable or restless. Having trouble sleeping. Feeling depressed. Craving a cigarette. To manage withdrawal symptoms: Avoid places, people, and activities that trigger your cravings. Remember why you want to quit. Get plenty of sleep. Avoid coffee and other caffeinated drinks. These may worsen some of your symptoms. These symptoms may surprise you. But be  assured that they are normal to have when quitting smoking. How to manage cravings Come up with a plan for how to deal with your cravings. The plan should include the following: A definition of the specific situation you want to deal with. An alternative action you will take. A  clear idea for how this action will help. The name of someone who might help you with this. Cravings usually last for 5-10 minutes. Consider taking the following actions to help you with your plan to deal with cravings: Keep your mouth busy. Chew sugar-free gum. Suck on hard candies or a straw. Brush your teeth. Keep your hands and body busy. Change to a different activity right away. Squeeze or play with a ball. Do an activity or a hobby, such as making bead jewelry, practicing needlepoint, or working with wood. Mix up your normal routine. Take a short exercise break. Go for a quick walk or run up and down stairs. Focus on doing something kind or helpful for someone else. Call a friend or family member to talk during a craving. Join a support group. Contact a quitline. Where to find support To get help or find a support group: Call the National Cancer Institute's Smoking Quitline: 1-800-QUIT NOW 985-764-6398) Visit the website of the Substance Abuse and Mental Health Services Administration: SkateOasis.com.pt Text QUIT to SmokefreeTXT: 878676 Where to find more information Visit these websites to find more information on quitting smoking: National Cancer Institute: www.smokefree.gov American Lung Association: www.lung.org American Cancer Society: www.cancer.org Centers for Disease Control and Prevention: FootballExhibition.com.br American Heart Association: www.heart.org Contact a health care provider if: You want to change your plan for quitting. The medicines you are taking are not helping. Your eating feels out of control or you cannot sleep. Get help right away if: You feel depressed or become very  anxious. Summary Quitting smoking is a physical and mental challenge. You will face cravings, withdrawal symptoms, and temptation to smoke again. Preparation can help you as you go through these challenges. Try different techniques to manage stress, handle social situations, and prevent weight gain. You can deal with cravings by keeping your mouth busy (such as by chewing gum), keeping your hands and body busy, calling family or friends, or contacting a quitline for people who want to quit smoking. You can deal with withdrawal symptoms by avoiding places where people smoke, getting plenty of rest, and avoiding drinks with caffeine. This information is not intended to replace advice given to you by your health care provider. Make sure you discuss any questions you have with your health care provider. Document Revised: 11/20/2020 Document Reviewed: 01/01/2019 Elsevier Patient Education  2022 [employer].

## 2021-01-29 NOTE — Progress Notes (Signed)
01/29/2021   Chief Complaint: Missed period  Transfer of Care Patient: no  History of Present Illness: [name] is a 25 y.o. G2P1001 [redacted]w[redacted]d based on Patient's last menstrual period was 12/20/2020. with an Estimated Date of Delivery: 09/26/21, with the above CC.   Her periods were: monthly She was using no method when she conceived.  She has Positive signs or symptoms of nausea/vomiting of pregnancy. She has Negative signs or symptoms of miscarriage or preterm labor She was not taking different medications around the time she conceived/early pregnancy. Since her LMP, she has not used alcohol Since her LMP, she has used tobacco products- vaping nicotine, working to cut back Since her LMP, she has used illegal drugs. Marijuana usage in early pregnancy- has now stopped  Current or past history of domestic violence. no  Infection History:  1. Since her LMP, she has not had a viral illness.  2. She admits to close contact with children on a regular basis     3. She unsure about a history of chicken pox. She is unsure about vaccination for chicken pox in the past. 4. Patient or partner has history of genital herpes  no 5. History of STI (GC, CT, HPV, syphilis, HIV)  no  6.  She does not live with someone with TB or TB exposed. 7. History of recent travel :  no 8. She identifies Negative Zika risk factors for her and her partner 12. There are cats in the home in the home.  She understands that while pregnant she should not change cat litter.   Genetic Screening Questions: (Includes patient, baby's father, or anyone in either family)   1. Patient's age >/= 100 at [facility]  no 2. Thalassemia ([place], [place], Mediterranean, or Asian background): MCV<80  no 3. Neural tube defect (meningomyelocele, spina bifida, anencephaly)  no 4. Congenital heart defect  no  5. Down syndrome  no 6. Tay-Sachs (Jewish, [place]  no 7. Canavan's Disease  no 8. Sickle cell disease or trait (African)   no  9. Hemophilia or other blood disorders  no  10. Muscular dystrophy  no  11. Cystic fibrosis  no  12. Huntington's Chorea  no  13. Mental retardation/autism  no 14. Other inherited genetic or chromosomal disorder  no 15. Maternal metabolic disorder (DM, PKU, etc)  no 16. Patient or FOB with a child with a birth defect not listed above no  16a. Patient or FOB with a birth defect themselves no 17. Recurrent pregnancy loss, or stillbirth  no  18. Any medications since LMP other than prenatal vitamins (include vitamins, supplements, OTC meds, drugs, alcohol)  no 19. Any other genetic/environmental exposure to discuss  no  ROS:  Review of Systems  Constitutional:  Negative for chills, fever, malaise/fatigue and weight loss.  HENT:  Negative for congestion, hearing loss and sinus pain.   Eyes:  Negative for blurred vision and double vision.  Respiratory:  Negative for cough, sputum production, shortness of breath and wheezing.   Cardiovascular:  Negative for chest pain, palpitations, orthopnea and leg swelling.  Gastrointestinal:  Negative for abdominal pain, constipation, diarrhea, nausea and vomiting.  Genitourinary:  Negative for dysuria, flank pain, frequency, hematuria and urgency.  Musculoskeletal:  Negative for back pain, falls and joint pain.  Skin:  Negative for itching and rash.  Neurological:  Negative for dizziness and headaches.  Psychiatric/Behavioral:  Negative for depression, substance abuse and suicidal ideas. The patient is not nervous/anxious.    OBGYN  History: As per HPI. OB History  Gravida Para Term Preterm AB Living  2 1 1     1   SAB IAB Ectopic Multiple Live Births        0 1    # Outcome Date GA Lbr Len/2nd Weight Sex Delivery Anes PTL Lv  2 Current           1 Term 11/02/17 [redacted]w[redacted]d 01:53 / 01:57 7 lb 3.3 oz (3.27 kg) M Vag-Spont EPI  LIV    Any issues with any prior pregnancies: no Any prior children are healthy, doing well, without any problems or  issues: yes Last pap smear never History of STIs: No   Past Medical History: Past Medical History:  Diagnosis Date   Anxiety    Asthma    pt states she has not taken medications for this since age 70   Depression     Past Surgical History: Past Surgical History:  Procedure Laterality Date   WISDOM TOOTH EXTRACTION      Family History:  Family History  Adopted: Yes  Problem Relation Age of Onset   Breast cancer Maternal Grandmother    Diabetes Mellitus II Maternal Grandmother    Depression Mother    Depression Sister    Depression Brother    Anxiety disorder Brother    Premature birth Sister    Depression Sister    She denies any female cancers, bleeding or blood clotting disorders.   Social History:  Social History   Socioeconomic History   Marital status: Single    Spouse name: Not on file   Number of children: 1   Years of education: Not on file   Highest education level: 11th grade  Occupational History   Occupation: unemployed  Tobacco Use   Smoking status: Former    Packs/day: 0.25    Years: 1.00    Pack years: 0.25    Types: E-cigarettes, Cigarettes    Start date: 03/26/2017    Quit date: 04/04/2017    Years since quitting: 3.8   Smokeless tobacco: Never  Vaping Use   Vaping Use: Every day  Substance and Sexual Activity   Alcohol use: Yes   Drug use: Yes    Types: Marijuana    Comment: had +MJ screen at [facility]   Sexual activity: Yes    Partners: Female    Birth control/protection: Injection  Other Topics Concern   Not on file  Social History Narrative   Lives with adoptive father and her boyfriend; baby boy is due 10/30/2017      Mom and older sister live in [place] and she does have periodic contact with them.   Social Determinants of Health   Financial Resource Strain: Not on file  Food Insecurity: Not on file  Transportation Needs: Not on file  Physical Activity: Not on file  Stress: Not on file  Social Connections: Not on file  Intimate  Partner Violence: Not on file    Allergy: No Known Allergies  Current Outpatient Medications:  Current Outpatient Medications:    amoxicillin (AMOXIL) 875 MG tablet, Take 1 tablet (875 mg total) by mouth 2 (two) times daily. (Patient not taking: Reported on 01/29/2021), Disp: 20 tablet, Rfl: 0   medroxyPROGESTERone (DEPO-PROVERA) 150 MG/ML injection, INJECT 1 ML (150 MG TOTAL) INTO THE MUSCLE EVERY 3 (THREE) MONTHS., Disp: 1 mL, Rfl: 3  Current Facility-Administered Medications:    medroxyPROGESTERone (DEPO-PROVERA) injection 150 mg, 150 mg, Intramuscular, Once, Malachy Mood, MD   Physical  Exam: Physical Exam Vitals and nursing note reviewed.  HENT:     Head: Normocephalic and atraumatic.  Eyes:     Pupils: Pupils are equal, round, and reactive to light.  Neck:     Thyroid: No thyromegaly.  Cardiovascular:     Rate and Rhythm: Normal rate and regular rhythm.  Pulmonary:     Effort: Pulmonary effort is normal.  Abdominal:     General: Bowel sounds are normal. There is no distension.     Palpations: Abdomen is soft.     Tenderness: There is no abdominal tenderness. There is no guarding or rebound.  Genitourinary:    Comments: External: Normal appearing vulva. No lesions noted.  Speculum examination: Normal appearing cervix. No blood in the vaginal vault. NO discharge.   Bimanual examination: Uterus midline, non-tender, normal in size, shape and contour.  No CMT. No adnexal masses. No adnexal tenderness. Pelvis not fixed.  Musculoskeletal:        General: Normal range of motion.     Cervical back: Normal range of motion and neck supple.  Skin:    General: Skin is warm and dry.  Neurological:     Mental Status: She is alert and oriented to person, place, and time.  Psychiatric:        Mood and Affect: Affect normal.        Judgment: Judgment normal.     Assessment: Ms. Ankitha Inwood is a 25 y.o. G2P1001 [redacted]w[redacted]d based on Patient's last menstrual period was 12/20/2020.  with an Estimated Date of Delivery: 09/26/21,  for prenatal care.  Plan:  1) Avoid alcoholic beverages. 2) Patient encouraged not to smoke.  3) Discontinue the use of all non-medicinal drugs and chemicals.  4) Take prenatal vitamins daily.  5) Seatbelt use advised 6) Nutrition, food safety (fish, cheese advisories, and high nitrite foods) and exercise discussed. 7) Hospital and practice style delivering at [facility] discussed  8) Patient is asked about travel to areas at risk for the [place] virus, and counseled to avoid travel and exposure to mosquitoes or sexual partners who may have themselves been exposed to the virus. Testing is discussed, and will be ordered as appropriate.  9) Childbirth classes at [facility] advised 10) Genetic Screening, such as with 1st Trimester Screening, cell free fetal DNA, AFP testing, and Ultrasound, as well as with amniocentesis and CVS as appropriate, is discussed with patient. She plans to have genetic testing this pregnancy.   [facility] labs today Transvaginal US : showed IUP with early gestational sac and yolk sac. No CRL seen today. Given information about nausea in pregnancy and management options Encouraged cessation of smoking and marijuana usage- patient cutting back   Problem list reviewed and updated.  I discussed the assessment and treatment plan with the patient. The patient was provided an opportunity to ask questions and all were answered. The patient agreed with the plan and demonstrated an understanding of the instructions.  Adrian Prows MD [facility], [facility] 01/29/2021 10:52 AM

## 2021-01-30 LAB — RPR+RH+ABO+RUB AB+AB SCR+CB...
Antibody Screen: NEGATIVE
HIV Screen 4th Generation wRfx: NONREACTIVE
Hematocrit: 35.9 % (ref 34.0–46.6)
Hemoglobin: 11.7 g/dL (ref 11.1–15.9)
Hepatitis B Surface Ag: NEGATIVE
MCH: 27.9 pg (ref 26.6–33.0)
MCHC: 32.6 g/dL (ref 31.5–35.7)
MCV: 86 fL (ref 79–97)
Platelets: 292 10*3/uL (ref 150–450)
RBC: 4.19 x10E6/uL (ref 3.77–5.28)
RDW: 13.6 % (ref 11.7–15.4)
RPR Ser Ql: NONREACTIVE
Rh Factor: POSITIVE
Rubella Antibodies, IGG: 1.48 index (ref >0.99)
Varicella zoster IgG: 229 index (ref >165)
WBC: 7.2 10*3/uL (ref 3.4–10.8)

## 2021-01-30 LAB — HEPATITIS C ANTIBODY: Hep C Virus Ab: <0.1 s/co ratio (ref 0.0–0.9)

## 2021-02-02 LAB — CYTOLOGY - PAP
Chlamydia: NEGATIVE
Comment: NEGATIVE
Comment: NEGATIVE
Comment: NORMAL
Diagnosis: NEGATIVE
Neisseria Gonorrhea: NEGATIVE
Trichomonas: NEGATIVE

## 2021-02-03 LAB — MONITOR DRUG PROFILE 10(MW)
Amphetamine Scrn, Ur: NEGATIVE ng/mL
BARBITURATE SCREEN URINE: NEGATIVE ng/mL
BENZODIAZEPINE SCREEN, URINE: NEGATIVE ng/mL
Cocaine (Metab) Scrn, Ur: NEGATIVE ng/mL
Creatinine(Crt), U: 39 mg/dL (ref 20.0–300.0)
Methadone Screen, Urine: NEGATIVE ng/mL
OXYCODONE+OXYMORPHONE UR QL SCN: NEGATIVE ng/mL
Opiate Scrn, Ur: NEGATIVE ng/mL
Ph of Urine: 7.9 (ref 4.5–8.9)
Phencyclidine Qn, Ur: NEGATIVE ng/mL
Propoxyphene Scrn, Ur: NEGATIVE ng/mL

## 2021-02-03 LAB — CANNABINOID (GC/MS), URINE
Cannabinoid: POSITIVE — AB
Carboxy THC (GC/MS): 324 ng/mL

## 2021-02-04 LAB — URINE CULTURE

## 2021-02-10 LAB — INHERITEST CORE(CF97,SMA,FRAX)

## 2021-02-12 ENCOUNTER — Other Ambulatory Visit: Payer: Self-pay

## 2021-02-12 ENCOUNTER — Ambulatory Visit (INDEPENDENT_AMBULATORY_CARE_PROVIDER_SITE_OTHER): Payer: Medicaid Other | Admitting: Obstetrics and Gynecology

## 2021-02-12 ENCOUNTER — Encounter: Payer: Self-pay | Admitting: Obstetrics and Gynecology

## 2021-02-12 VITALS — BP 104/70 | Wt 119.0 lb

## 2021-02-12 DIAGNOSIS — Z3480 Encounter for supervision of other normal pregnancy, unspecified trimester: Secondary | ICD-10-CM

## 2021-02-12 DIAGNOSIS — Z3A09 9 weeks gestation of pregnancy: Secondary | ICD-10-CM | POA: Diagnosis not present

## 2021-02-12 DIAGNOSIS — Z3481 Encounter for supervision of other normal pregnancy, first trimester: Secondary | ICD-10-CM

## 2021-02-12 NOTE — Progress Notes (Signed)
  Routine Prenatal Care Visit  Subjective  Amy [name] is a 25 y.o. G2P1001 at [redacted]w[redacted]d being seen today for ongoing prenatal care.  She is currently monitored for the following issues for this low-risk pregnancy and has Severe recurrent major depression without psychotic features (HCC); Eating disorder; Generalized anxiety disorder; Supervision of high risk pregnancy, antepartum; Late prenatal care; Indication for care in labor and delivery, antepartum; Normal labor; and Supervision of other normal pregnancy, antepartum on their problem list.  ----------------------------------------------------------------------------------- Patient reports no complaints.    . Vag. Bleeding: None.   . Leaking Fluid denies.  U/s today changes EDD ----------------------------------------------------------------------------------- The following portions of the patient's history were reviewed and updated as appropriate: allergies, current medications, past family history, past medical history, past social history, past surgical history and problem list. Problem list updated.  Objective  Blood pressure 104/70, weight 119 lb (54 kg), last menstrual period 12/20/2020, currently breastfeeding. Pregravid weight 116 lb (52.6 kg) Total Weight Gain 3 lb (1.361 kg) Urinalysis: Urine Protein    Urine Glucose    Fetal Status: Fetal Heart Rate (bpm): present ([place])         General:  Alert, oriented and cooperative. Patient is in no acute distress.  Skin: Skin is warm and dry. No rash noted.   Cardiovascular: Normal heart rate noted  Respiratory: Normal respiratory effort, no problems with respiration noted  Abdomen: Soft, gravid, appropriate for gestational age.       Pelvic:  Cervical exam deferred        Extremities: Normal range of motion.     Mental Status: Normal mood and affect. Normal behavior. Normal judgment and thought content.   Assessment   25 y.o. G2P1001 at [redacted]w[redacted]d by  09/11/2021, by Ultrasound  presenting for routine prenatal visit  Plan   pregnancy 2 Problems (from 01/29/21 to present)     Problem Noted Resolved   Supervision of other normal pregnancy, antepartum 01/29/2021 by Natale Milch, MD No   Overview Signed 01/29/2021 11:11 AM by Natale Milch, MD     Nursing Staff Provider  Office Location  [facility] Dating    Language  English Anatomy US    Flu Vaccine  Declines Genetic Screen  NIPS:   TDaP vaccine    Hgb A1C or  GTT Early : Third trimester :   Covid Unvaccinated covid in 2021   LAB RESULTS   Rhogam   Blood Type     Feeding Plan  Antibody    Contraception  Rubella    Circumcision  RPR     Pediatrician   HBsAg     Support Person  HIV    Prenatal Classes  Varicella     GBS  (For PCN allergy, check sensitivities)   BTL Consent     VBAC Consent  Pap  2022    Hgb Electro      CF      SMA                   Preterm labor symptoms and general obstetric precautions including but not limited to vaginal bleeding, contractions, leaking of fluid and fetal movement were reviewed in detail with the patient. Please refer to After Visit Summary for other counseling recommendations.   Return in about 4 weeks (around 03/12/2021) for ROB.   Thomasene Mohair, MD, [facility], [facility] 02/12/2021 3:42 PM

## 2021-02-12 NOTE — Progress Notes (Signed)
ULTRASOUND REPORT  Location: [facility] Date of Service: 02/12/2021   Indications:Unsure LMP (Transvaginal) Findings:  Singleton intrauterine pregnancy is visualized with a CRL consistent with [redacted]w[redacted]d gestation, giving an (U/S) EDD of 09/11/2021. The (U/S) EDD is not consistent with the clinically established EDD of 09/26/2021.  FHR: 179 BPM CRL measurement: 29.5 mm Yolk sac is visualized and appears normal. Amnion: visualized and appears normal   Right Ovary not visualized. Left Ovary is normal appearance. Corpus luteal cyst:  Left ovary Survey of the adnexa demonstrates no adnexal masses. There is no free peritoneal fluid in the cul de sac.  Impression: 1. [redacted]w[redacted]d Viable Singleton Intrauterine pregnancy by U/S. 2. (U/S) EDD is consistent with Clinically established EDD of 09/11/2021.  There is a viable singleton gestation.  Detailed evaluation of the fetal anatomy is precluded by early gestational age.  It must be noted that a normal ultrasound particular at this early gestational age is unable to rule out fetal aneuploidy, risk of first trimester miscarriage, or anatomic birth defects.  I personally performed the ultrasound and interpreted the images.    Thomasene Mohair, MD, [facility], [facility] 02/12/2021 3:41 PM

## 2021-02-25 DIAGNOSIS — Z419 Encounter for procedure for purposes other than remedying health state, unspecified: Secondary | ICD-10-CM | POA: Diagnosis not present

## 2021-03-12 ENCOUNTER — Encounter: Payer: Medicaid Other | Admitting: Obstetrics and Gynecology

## 2021-03-17 ENCOUNTER — Ambulatory Visit (INDEPENDENT_AMBULATORY_CARE_PROVIDER_SITE_OTHER): Payer: Medicaid Other | Admitting: Licensed Practical Nurse

## 2021-03-17 ENCOUNTER — Encounter: Payer: Self-pay | Admitting: Licensed Practical Nurse

## 2021-03-17 ENCOUNTER — Other Ambulatory Visit: Payer: Self-pay

## 2021-03-17 VITALS — BP 98/56 | Wt 124.0 lb

## 2021-03-17 DIAGNOSIS — Z3480 Encounter for supervision of other normal pregnancy, unspecified trimester: Secondary | ICD-10-CM

## 2021-03-17 DIAGNOSIS — Z3A14 14 weeks gestation of pregnancy: Secondary | ICD-10-CM

## 2021-03-17 LAB — POCT URINALYSIS DIPSTICK OB
Glucose, UA: NEGATIVE
POC,PROTEIN,UA: NEGATIVE

## 2021-03-17 NOTE — Progress Notes (Signed)
Routine Prenatal Care Visit  Subjective  Amy [name] is a 25 y.o. G2P1001 at [redacted]w[redacted]d being seen today for ongoing prenatal care.  She is currently monitored for the following issues for this low-risk pregnancy and has Severe recurrent major depression without psychotic features (HCC); Eating disorder; Generalized anxiety disorder; Supervision of high risk pregnancy, antepartum; Late prenatal care; Indication for care in labor and delivery, antepartum; Normal labor; and Supervision of other normal pregnancy, antepartum on their problem list.  ----------------------------------------------------------------------------------- Patient reports  cramping .  Here with partner.  Reports her mood is up and down, tends to get cranky when she is hungry.  But feels over all her mood is "fine".  Considering formula feeding as last time she was not able to produce much milk despite trying everything to produce. This is Michel's first baby, however he has been around and supported family members in labor-has a good understanding of labor and birth.   . Vag. Bleeding: None.   . Leaking Fluid denies.  ----------------------------------------------------------------------------------- The following portions of the patient's history were reviewed and updated as appropriate: allergies, current medications, past family history, past medical history, past social history, past surgical history and problem list. Problem list updated.  Objective  Blood pressure (!) 98/56, weight 124 lb (56.2 kg), last menstrual period 12/20/2020, currently breastfeeding. Pregravid weight 116 lb (52.6 kg) Total Weight Gain 8 lb (3.629 kg) Urinalysis: Urine Protein Negative  Urine Glucose Negative  Fetal Status:         FHT 150   General:  Alert, oriented and cooperative. Patient is in no acute distress.  Skin: Skin is warm and dry. No rash noted.   Cardiovascular: Normal heart rate noted  Respiratory: Normal respiratory  effort, no problems with respiration noted  Abdomen: Soft, gravid, appropriate for gestational age.       Pelvic:  Cervical exam deferred        Extremities: Normal range of motion.     Mental Status: Normal mood and affect. Normal behavior. Normal judgment and thought content.   Assessment   25 y.o. G2P1001 at [redacted]w[redacted]d by  09/11/2021, by Ultrasound presenting for routine prenatal visit  Plan   pregnancy 2 Problems (from 01/29/21 to present)     Problem Noted Resolved   Supervision of other normal pregnancy, antepartum 01/29/2021 by Natale Milch, MD No   Overview Signed 01/29/2021 11:11 AM by Natale Milch, MD     Nursing Staff Provider  Office Location  [facility] Dating  11/18 [redacted]w[redacted]d  Language  English Anatomy US    Flu Vaccine  Declines Genetic Screen  NIPS:   TDaP vaccine    Hgb A1C or  GTT Early : Third trimester :   Covid Unvaccinated covid in 2021   LAB RESULTS   Rhogam  na Blood Type     Feeding Plan Formula  Antibody    Contraception  Rubella    Circumcision  RPR     Pediatrician   HBsAg     Support Person Michel  HIV    Prenatal Classes Multip  Varicella     GBS  (For PCN allergy, check sensitivities)   BTL Consent     VBAC Consent  Pap  2022    Hgb Electro      CF      SMA                    Preterm labor symptoms and general obstetric precautions including  but not limited to vaginal bleeding, contractions, leaking of fluid and fetal movement were reviewed in detail with the patient. Please refer to After Visit Summary for other counseling recommendations.   Anatomy US in 4-6 wks   Return in about 4 weeks (around 04/14/2021) for ROB.  Carie Caddy, CNM  [facility], [facility]  03/17/21  1:52 PM

## 2021-03-28 DIAGNOSIS — Z419 Encounter for procedure for purposes other than remedying health state, unspecified: Secondary | ICD-10-CM | POA: Diagnosis not present

## 2021-03-28 NOTE — L&D Delivery Note (Addendum)
Delivery Note   Amy [name] is a 26 y.o. G2P1001 at [redacted]w[redacted]d Estimated Date of Delivery: 09/11/21  PRE-OPERATIVE DIAGNOSIS:  1) [redacted]w[redacted]d pregnancy.  Anemia  POST-OPERATIVE DIAGNOSIS:  1) [redacted]w[redacted]d pregnancy s/p Vaginal, Spontaneous   Delivery Type: Vaginal, Spontaneous    Delivery Anesthesia: None   Labor Complications:  None    ESTIMATED BLOOD LOSS: 250  ml    FINDINGS:   1) Viable female infant, "Tasia Catchings," Apgar scores of 8   at 1 minute and 9   at 5 minutes; birthweight 7 lbs.,02 oz (3180 g).    SPECIMENS:   PLACENTA:   Appearance: Intact , marginal cord insertion   Removal: Spontaneous      Disposition:  Per protocol  CORD BLOOD: Collected  DISPOSITION:  Infant to left in stable condition in the delivery room, with L&D personnel and mother,  NARRATIVE SUMMARY: Labor course:  [name] is a G2P1001 at [redacted]w[redacted]d who presented to Labor & Delivery around 0100 for labor management. Her initial cervical exam was 5/80--2. She progressed rapidly and  was began bearing down spontaneously at 0211. With excellent maternal pushing effort, she birthed a viable female infant at 0216 in a kneeling position. The shoulders were birthed without difficulty. The infant was placed skin-to-skin with [name]. The cord was doubly clamped and cut when pulsations ceased. The placenta delivered spontaneously and was noted to be intact with a 3VC. Trailing membranes were teased out with ring forceps. A perineal and vaginal examination was performed. Lacerations:1st degree perineal, right labial Lacerations were repaired with Vicryl suture using local anesthesia. The patient tolerated this well. Mother and baby were left in stable condition.  Dr. Okey Dupre was available following delivery for assistance as needed.  [name], CNM 09/08/2021 3:30 AM

## 2021-04-16 ENCOUNTER — Ambulatory Visit (INDEPENDENT_AMBULATORY_CARE_PROVIDER_SITE_OTHER): Payer: Medicaid Other | Admitting: Advanced Practice Midwife

## 2021-04-16 ENCOUNTER — Other Ambulatory Visit: Payer: Self-pay

## 2021-04-16 ENCOUNTER — Encounter: Payer: Self-pay | Admitting: Advanced Practice Midwife

## 2021-04-16 VITALS — BP 100/60 | Wt 130.0 lb

## 2021-04-16 DIAGNOSIS — Z3482 Encounter for supervision of other normal pregnancy, second trimester: Secondary | ICD-10-CM

## 2021-04-16 DIAGNOSIS — Z3A18 18 weeks gestation of pregnancy: Secondary | ICD-10-CM

## 2021-04-16 NOTE — Progress Notes (Signed)
Routine Prenatal Care Visit  Subjective  Amy [name] is a 26 y.o. G2P1001 at 107w6d being seen today for ongoing prenatal care.  She is currently monitored for the following issues for this low-risk pregnancy and has Severe recurrent major depression without psychotic features (HCC); Eating disorder; Generalized anxiety disorder; Late prenatal care; Indication for care in labor and delivery, antepartum; Normal labor; and Supervision of other normal pregnancy, antepartum on their problem list.  ----------------------------------------------------------------------------------- Patient reports no complaints.   Contractions: Not present. Vag. Bleeding: None.  Movement: Present. Leaking Fluid denies.  ----------------------------------------------------------------------------------- The following portions of the patient's history were reviewed and updated as appropriate: allergies, current medications, past family history, past medical history, past social history, past surgical history and problem list. Problem list updated.  Objective  Blood pressure 100/60, weight 130 lb (59 kg), last menstrual period 12/20/2020 Pregravid weight 116 lb (52.6 kg) Total Weight Gain 14 lb (6.35 kg) Urinalysis: Urine Protein    Urine Glucose    Fetal Status: Fetal Heart Rate (bpm): 155 Fundal Height: 19 cm Movement: Present     General:  Alert, oriented and cooperative. Patient is in no acute distress.  Skin: Skin is warm and dry. No rash noted.   Cardiovascular: Normal heart rate noted  Respiratory: Normal respiratory effort, no problems with respiration noted  Abdomen: Soft, gravid, appropriate for gestational age. Pain/Pressure: Present     Pelvic:  Cervical exam deferred        Extremities: Normal range of motion.  Edema: None  Mental Status: Normal mood and affect. Normal behavior. Normal judgment and thought content.   Assessment   26 y.o. G2P1001 at [redacted]w[redacted]d by  09/11/2021, by Ultrasound  presenting for routine prenatal visit  Plan   pregnancy 2 Problems (from 01/29/21 to present)    Problem Noted Resolved   Supervision of other normal pregnancy, antepartum 01/29/2021 by Natale Milch, MD No   Overview Addendum 03/17/2021  1:50 PM by Ellwood Sayers, CNM     Nursing Staff Provider  Office Location  [facility] Dating  11/18 [redacted]w[redacted]d  Language  English Anatomy US    Flu Vaccine  Declines Genetic Screen  NIPS:   TDaP vaccine    Hgb A1C or  GTT Early : Third trimester :   Covid Unvaccinated covid in 2021   LAB RESULTS   Rhogam  na Blood Type O/Positive/-- (11/04 1126)   Feeding Plan Formula  Antibody Negative (11/04 1126)  Contraception  Rubella 1.48 (11/04 1126)  Circumcision  RPR Non Reactive (11/04 1126)   Pediatrician   HBsAg Negative (11/04 1126)   Support Person Kai Levins  HIV Non Reactive (11/04 1126)  Prenatal Classes multip  Varicella Immune     GBS  (For PCN allergy, check sensitivities)   BTL Consent     VBAC Consent  Pap  2022    Hgb Electro      CF      SMA                   Preterm labor symptoms and general obstetric precautions including but not limited to vaginal bleeding, contractions, leaking of fluid and fetal movement were reviewed in detail with the patient.    Return in about 2 weeks (around 04/30/2021) for anatomy scan needs to be scheduled and rob after.  Tresea Mall, CNM 04/16/2021 10:46 AM

## 2021-04-28 DIAGNOSIS — Z419 Encounter for procedure for purposes other than remedying health state, unspecified: Secondary | ICD-10-CM | POA: Diagnosis not present

## 2021-04-30 ENCOUNTER — Other Ambulatory Visit: Payer: Self-pay

## 2021-04-30 ENCOUNTER — Ambulatory Visit
Admission: RE | Admit: 2021-04-30 | Discharge: 2021-04-30 | Disposition: A | Discharge status: Home / Self Care | Payer: Medicaid Other | Source: Ambulatory Visit | Attending: Licensed Practical Nurse | Admitting: Licensed Practical Nurse

## 2021-04-30 DIAGNOSIS — Z3480 Encounter for supervision of other normal pregnancy, unspecified trimester: Secondary | ICD-10-CM

## 2021-04-30 DIAGNOSIS — Z3689 Encounter for other specified antenatal screening: Secondary | ICD-10-CM | POA: Insufficient documentation

## 2021-04-30 DIAGNOSIS — Z3A20 20 weeks gestation of pregnancy: Secondary | ICD-10-CM | POA: Insufficient documentation

## 2021-04-30 DIAGNOSIS — Z3492 Encounter for supervision of normal pregnancy, unspecified, second trimester: Secondary | ICD-10-CM | POA: Diagnosis not present

## 2021-05-03 ENCOUNTER — Encounter: Payer: Medicaid Other | Admitting: Licensed Practical Nurse

## 2021-05-04 ENCOUNTER — Ambulatory Visit (INDEPENDENT_AMBULATORY_CARE_PROVIDER_SITE_OTHER): Payer: Medicaid Other | Admitting: Obstetrics

## 2021-05-04 ENCOUNTER — Other Ambulatory Visit: Payer: Self-pay

## 2021-05-04 VITALS — BP 102/68 | Wt 135.0 lb

## 2021-05-04 DIAGNOSIS — Z3482 Encounter for supervision of other normal pregnancy, second trimester: Secondary | ICD-10-CM

## 2021-05-04 DIAGNOSIS — Z3A21 21 weeks gestation of pregnancy: Secondary | ICD-10-CM

## 2021-05-04 NOTE — Progress Notes (Signed)
Routine Prenatal Care Visit  Subjective  Amy [name] is a 26 y.o. G2P1001 at [redacted]w[redacted]d being seen today for ongoing prenatal care.  She is currently monitored for the following issues for this low-risk pregnancy and has Severe recurrent major depression without psychotic features ([place]); Eating disorder; Generalized anxiety disorder; and Supervision of other normal pregnancy, antepartum on their problem list.  ----------------------------------------------------------------------------------- Patient reports no complaints.   Contractions: Not present. Vag. Bleeding: None.  Movement: Present. Leaking Fluid denies.  ----------------------------------------------------------------------------------- The following portions of the patient's history were reviewed and updated as appropriate: allergies, current medications, past family history, past medical history, past social history, past surgical history and problem list. Problem list updated.  Objective  Blood pressure 102/68, weight 135 lb (61.2 kg), last menstrual period 12/20/2020, currently breastfeeding. Pregravid weight 116 lb (52.6 kg) Total Weight Gain 19 lb (8.618 kg) Urinalysis: Urine Protein    Urine Glucose    Fetal Status:     Movement: Present     General:  Alert, oriented and cooperative. Patient is in no acute distress.  Skin: Skin is warm and dry. No rash noted.   Cardiovascular: Normal heart rate noted  Respiratory: Normal respiratory effort, no problems with respiration noted  Abdomen: Soft, gravid, appropriate for gestational age. Pain/Pressure: Present     Pelvic:  Cervical exam deferred        Extremities: Normal range of motion.     Mental Status: Normal mood and affect. Normal behavior. Normal judgment and thought content.   Assessment   26 y.o. G2P1001 at [redacted]w[redacted]d by  09/11/2021, by Ultrasound presenting for routine prenatal visit  Plan   pregnancy 2 Problems (from 01/29/21 to present)    Problem Noted  Resolved   Supervision of other normal pregnancy, antepartum 01/29/2021 by Homero Fellers, MD No   Overview Addendum 03/17/2021  1:50 PM by Allen Derry, [place] Staff Provider  Office Location  [facility] Dating  11/18 [redacted]w[redacted]d  Language  English Anatomy US    Flu Vaccine  Declines Genetic Screen  NIPS:   TDaP vaccine    Hgb A1C or  GTT Early : Third trimester :   Covid Unvaccinated covid in 2021   LAB RESULTS   Rhogam  na Blood Type O/Positive/-- (11/04 1126)   Feeding Plan Formula  Antibody Negative (11/04 1126)  Contraception  Rubella 1.48 (11/04 1126)  Circumcision  RPR Non Reactive (11/04 1126)   Pediatrician   HBsAg Negative (11/04 1126)   Support Person Hurley Cisco  HIV Non Reactive (11/04 1126)  Prenatal Classes multip  Varicella Immune     GBS  (For PCN allergy, check sensitivities)   BTL Consent     VBAC Consent  Pap  2022    Hgb Electro      CF      SMA                   Preterm labor symptoms and general obstetric precautions including but not limited to vaginal bleeding, contractions, leaking of fluid and fetal movement were reviewed in detail with the patient. Please refer to After Visit Summary for other counseling recommendations.  She does not know gender and her family is planning agender reveal party.  Return in about 4 weeks (around 06/01/2021) for return OB.  Imagene Riches, CNM  05/04/2021 11:04 AM

## 2021-05-26 DIAGNOSIS — Z419 Encounter for procedure for purposes other than remedying health state, unspecified: Secondary | ICD-10-CM | POA: Diagnosis not present

## 2021-06-01 ENCOUNTER — Encounter: Payer: Medicaid Other | Admitting: Advanced Practice Midwife

## 2021-06-02 ENCOUNTER — Ambulatory Visit (INDEPENDENT_AMBULATORY_CARE_PROVIDER_SITE_OTHER): Payer: Medicaid Other | Admitting: Obstetrics

## 2021-06-02 ENCOUNTER — Other Ambulatory Visit: Payer: Self-pay

## 2021-06-02 VITALS — BP 118/70 | Wt 141.0 lb

## 2021-06-02 DIAGNOSIS — Z3A25 25 weeks gestation of pregnancy: Secondary | ICD-10-CM

## 2021-06-02 DIAGNOSIS — Z3482 Encounter for supervision of other normal pregnancy, second trimester: Secondary | ICD-10-CM

## 2021-06-02 DIAGNOSIS — Z3480 Encounter for supervision of other normal pregnancy, unspecified trimester: Secondary | ICD-10-CM

## 2021-06-02 NOTE — Progress Notes (Signed)
Routine Prenatal Care Visit ? ?Subjective  ?Amy [name] is a 26 y.o. G2P1001 at [redacted]w[redacted]d being seen today for ongoing prenatal care.  She is currently monitored for the following issues for this low risk pregnancy and has Severe recurrent major depression without psychotic features (HCC); Eating disorder; Generalized anxiety disorder; and Supervision of other normal pregnancy, antepartum on their problem list.  ?----------------------------------------------------------------------------------- ?Patient reports no complaints. Yesterday she endorses some cramping following [place], and did report an episode of vomiting after an acidic meal.    ?Contractions: Not present. Vag. Bleeding: None.  Movement: Present. Leaking Fluid denies. Marland Kitchen  ?----------------------------------------------------------------------------------- ?The following portions of the patient's history were reviewed and updated as appropriate: allergies, current medications, past family history, past medical history, past social history, past surgical history and problem list. Problem list updated. ? ?Objective  ?Blood pressure 118/70, weight 141 lb (64 kg), last menstrual period 12/20/2020, currently breastfeeding. ?Pregravid weight 116 lb (52.6 kg) Total Weight Gain 25 lb (11.3 kg) ?Urinalysis: Urine Protein    Urine Glucose   ? ?Fetal Status:     Movement: Present    ? ?General:  Alert, oriented and cooperative. Patient is in no acute distress.  ?Skin: Skin is warm and dry. No rash noted.   ?Cardiovascular: Normal heart rate noted  ?Respiratory: Normal respiratory effort, no problems with respiration noted  ?Abdomen: Soft, gravid, appropriate for gestational age. Pain/Pressure: Absent     ?Pelvic:  Cervical exam deferred        ?Extremities: Normal range of motion.     ?Mental Status: Normal mood and affect. Normal behavior. Normal judgment and thought content.  ? ?Assessment  ? ?26 y.o. G2P1001 at [redacted]w[redacted]d by   09/11/2021, by Ultrasound presenting for routine prenatal visit ? ?Plan  ? ?pregnancy 2 Problems (from 01/29/21 to present)   ? Problem Noted Resolved  ? Supervision of other normal pregnancy, antepartum 01/29/2021 by Natale Milch, MD No  ? Overview Addendum 06/02/2021  1:59 PM by Mirna Mires, CNM  ?   ?Nursing Staff Provider  ?Office Location  [facility] Dating    ?Language  English Anatomy US  Normal anatomy- pt doesn't know gender  ?Flu Vaccine  Declines Genetic Screen  NIPS:   ?TDaP vaccine    Hgb A1C or  ?GTT Early : ?Third trimester :   ?Covid Unvaccinated ?covid in 2021   LAB RESULTS   ?Rhogam  na Blood Type O/Positive/-- (11/04 1126)   ?Feeding Plan Formula  Antibody Negative (11/04 1126)  ?Contraception  Rubella 1.48 (11/04 1126)  ?Circumcision  RPR Non Reactive (11/04 1126)   ?Pediatrician   HBsAg Negative (11/04 1126)   ?Support Person Kai Levins  HIV Non Reactive (11/04 1126)  ?Prenatal Classes multip  Varicella Immune   ?  GBS  (For PCN allergy, check sensitivities)   ?BTL Consent     ?VBAC Consent  Pap  2022  ?  Hgb Electro    ?  CF   ?   SMA   ?     ? ? ?  ?  ?  ?  ? ?Preterm labor symptoms and general obstetric precautions including but not limited to vaginal bleeding, contractions, leaking of fluid and fetal movement were reviewed in detail with the patient. ?Please refer to After Visit Summary for other counseling recommendations. Counseled on upcoming appointments with glucose tolerance test at next appointment.  ? ?Appointment in 3 weeks for 28 week labs.  ? ?  Mirna Mires, CNM  ?06/02/2021 2:00 PM   ? ?

## 2021-06-02 NOTE — Progress Notes (Signed)
No vb. No lof.  

## 2021-06-25 ENCOUNTER — Encounter: Payer: Self-pay | Admitting: Advanced Practice Midwife

## 2021-06-25 ENCOUNTER — Other Ambulatory Visit: Payer: Medicaid Other

## 2021-06-25 ENCOUNTER — Ambulatory Visit (INDEPENDENT_AMBULATORY_CARE_PROVIDER_SITE_OTHER): Payer: Medicaid Other | Admitting: Advanced Practice Midwife

## 2021-06-25 VITALS — BP 100/62 | Wt 147.0 lb

## 2021-06-25 DIAGNOSIS — Z3482 Encounter for supervision of other normal pregnancy, second trimester: Secondary | ICD-10-CM

## 2021-06-25 DIAGNOSIS — Z3A28 28 weeks gestation of pregnancy: Secondary | ICD-10-CM

## 2021-06-25 DIAGNOSIS — Z3480 Encounter for supervision of other normal pregnancy, unspecified trimester: Secondary | ICD-10-CM

## 2021-06-25 LAB — POCT URINALYSIS DIPSTICK OB
Glucose, UA: NEGATIVE
POC,PROTEIN,UA: NEGATIVE

## 2021-06-25 LAB — OB RESULTS CONSOLE VARICELLA ZOSTER ANTIBODY, IGG: Varicella: IMMUNE

## 2021-06-25 NOTE — Patient Instructions (Signed)

## 2021-06-25 NOTE — Addendum Note (Signed)
Addended by: Burtis Junes on: 06/25/2021 09:54 AM ? ? Modules accepted: Orders ? ?

## 2021-06-25 NOTE — Progress Notes (Signed)
Routine Prenatal Care Visit ? ?Subjective  ?Amy [name] is a 26 y.o. G2P1001 at [redacted]w[redacted]d being seen today for ongoing prenatal care.  She is currently monitored for the following issues for this low-risk pregnancy and has Severe recurrent major depression without psychotic features (HCC); Eating disorder; Generalized anxiety disorder; and Supervision of other normal pregnancy, antepartum on their problem list.  ?----------------------------------------------------------------------------------- ?Patient reports no complaints.  28 wk labs today. Discussed limiting sweet tea generally to avoid excessive weight gain. Expected weight gain up to 40 pounds given low pre-pregnant BMI. ?Contractions: Not present. Vag. Bleeding: None.  Movement: Present. Leaking Fluid denies.  ?----------------------------------------------------------------------------------- ?The following portions of the patient's history were reviewed and updated as appropriate: allergies, current medications, past family history, past medical history, past social history, past surgical history and problem list. Problem list updated. ? ?Objective  ?Blood pressure 100/62, weight 147 lb (66.7 kg), last menstrual period 12/20/2020 ?Pregravid weight 116 lb (52.6 kg) Total Weight Gain 31 lb (14.1 kg) ?Urinalysis: Urine Protein    Urine Glucose   ? ?Fetal Status: Fetal Heart Rate (bpm): 144 Fundal Height: 29 cm Movement: Present    ? ?General:  Alert, oriented and cooperative. Patient is in no acute distress.  ?Skin: Skin is warm and dry. No rash noted.   ?Cardiovascular: Normal heart rate noted  ?Respiratory: Normal respiratory effort, no problems with respiration noted  ?Abdomen: Soft, gravid, appropriate for gestational age. Pain/Pressure: Present     ?Pelvic:  Cervical exam deferred        ?Extremities: Normal range of motion.  Edema: None  ?Mental Status: Normal mood and affect. Normal behavior. Normal judgment and thought content.   ? ?Assessment  ? ?26 y.o. G2P1001 at [redacted]w[redacted]d by  09/11/2021, by Ultrasound presenting for routine prenatal visit ? ?Plan  ? ?pregnancy 2 Problems (from 01/29/21 to present)   ? Problem Noted Resolved  ? Supervision of other normal pregnancy, antepartum 01/29/2021 by Natale Milch, MD No  ? Overview Addendum 06/02/2021  1:59 PM by Mirna Mires, CNM  ?   ?Nursing Staff Provider  ?Office Location  [facility] Dating    ?Language  English Anatomy US  Normal anatomy- pt doesn't know gender  ?Flu Vaccine  Declines Genetic Screen  NIPS:   ?TDaP vaccine    Hgb A1C or  ?GTT Early : ?Third trimester :   ?Covid Unvaccinated ?covid in 2021   LAB RESULTS   ?Rhogam  na Blood Type O/Positive/-- (11/04 1126)   ?Feeding Plan Formula  Antibody Negative (11/04 1126)  ?Contraception  Rubella 1.48 (11/04 1126)  ?Circumcision  RPR Non Reactive (11/04 1126)   ?Pediatrician   HBsAg Negative (11/04 1126)   ?Support Person Kai Levins  HIV Non Reactive (11/04 1126)  ?Prenatal Classes multip  Varicella Immune   ?  GBS  (For PCN allergy, check sensitivities)   ?BTL Consent     ?VBAC Consent  Pap  2022  ?  Hgb Electro    ?  CF   ?   SMA   ?     ? ? ?  ?  ?  ?  ? ?Preterm labor symptoms and general obstetric precautions including but not limited to vaginal bleeding, contractions, leaking of fluid and fetal movement were reviewed in detail with the patient. ?Please refer to After Visit Summary for other counseling recommendations.  ? ?Return in about 2 weeks (around 07/09/2021) for rob. ? ?Tresea Mall, CNM ?06/25/2021 9:39 AM   ? ?

## 2021-06-26 DIAGNOSIS — Z419 Encounter for procedure for purposes other than remedying health state, unspecified: Secondary | ICD-10-CM | POA: Diagnosis not present

## 2021-06-26 LAB — 28 WEEK RH+PANEL
Basophils Absolute: 0 10*3/uL (ref 0.0–0.2)
Basos: 1 %
EOS (ABSOLUTE): 0.2 10*3/uL (ref 0.0–0.4)
Eos: 3 %
Gestational Diabetes Screen: 128 mg/dL (ref 70–139)
HIV Screen 4th Generation wRfx: NONREACTIVE
Hematocrit: 26.3 % — ABNORMAL LOW (ref 34.0–46.6)
Hemoglobin: 8.4 g/dL — ABNORMAL LOW (ref 11.1–15.9)
Immature Grans (Abs): 0.1 10*3/uL (ref 0.0–0.1)
Immature Granulocytes: 1 %
Lymphocytes Absolute: 1.6 10*3/uL (ref 0.7–3.1)
Lymphs: 20 %
MCH: 26.1 pg — ABNORMAL LOW (ref 26.6–33.0)
MCHC: 31.9 g/dL (ref 31.5–35.7)
MCV: 82 fL (ref 79–97)
Monocytes Absolute: 0.5 10*3/uL (ref 0.1–0.9)
Monocytes: 6 %
Neutrophils Absolute: 5.3 10*3/uL (ref 1.4–7.0)
Neutrophils: 69 %
Platelets: 378 10*3/uL (ref 150–450)
RBC: 3.22 x10E6/uL — ABNORMAL LOW (ref 3.77–5.28)
RDW: 14.2 % (ref 11.7–15.4)
RPR Ser Ql: NONREACTIVE
WBC: 7.6 10*3/uL (ref 3.4–10.8)

## 2021-06-27 ENCOUNTER — Encounter: Payer: Self-pay | Admitting: Obstetrics

## 2021-06-27 DIAGNOSIS — O99013 Anemia complicating pregnancy, third trimester: Secondary | ICD-10-CM | POA: Insufficient documentation

## 2021-07-09 ENCOUNTER — Encounter: Payer: Self-pay | Admitting: Licensed Practical Nurse

## 2021-07-09 ENCOUNTER — Ambulatory Visit (INDEPENDENT_AMBULATORY_CARE_PROVIDER_SITE_OTHER): Payer: Medicaid Other | Admitting: Licensed Practical Nurse

## 2021-07-09 VITALS — BP 90/60 | Wt 147.0 lb

## 2021-07-09 DIAGNOSIS — Z3480 Encounter for supervision of other normal pregnancy, unspecified trimester: Secondary | ICD-10-CM

## 2021-07-09 DIAGNOSIS — Z23 Encounter for immunization: Secondary | ICD-10-CM | POA: Diagnosis not present

## 2021-07-09 DIAGNOSIS — Z3A30 30 weeks gestation of pregnancy: Secondary | ICD-10-CM

## 2021-07-09 LAB — POCT URINALYSIS DIPSTICK OB
Glucose, UA: NEGATIVE
POC,PROTEIN,UA: NEGATIVE

## 2021-07-09 NOTE — Progress Notes (Signed)
ROB- TDAP/BT consent signed today, no concerns 

## 2021-07-09 NOTE — Progress Notes (Signed)
Routine Prenatal Care Visit ? ?Subjective  ?Amy [name] is a 26 y.o. G2P1001 at [redacted]w[redacted]d being seen today for ongoing prenatal care.  She is currently monitored for the following issues for this low-risk pregnancy and has Severe recurrent major depression without psychotic features (HCC); Eating disorder; Generalized anxiety disorder; Supervision of other normal pregnancy, antepartum; and Anemia in pregnancy, third trimester on their problem list.  ?----------------------------------------------------------------------------------- ?Patient reports no complaints.  Has occasional swelling in her feet.  Mood is good.  This is her partner's first child. Has not yet picked up Fe supplement, will go today.  ?Contractions: Not present.  .  Movement: Present. Leaking Fluid denies.  ?----------------------------------------------------------------------------------- ?The following portions of the patient's history were reviewed and updated as appropriate: allergies, current medications, past family history, past medical history, past social history, past surgical history and problem list. Problem list updated. ? ?Objective  ?Blood pressure 90/60, weight 147 lb (66.7 kg), last menstrual period 12/20/2020, currently breastfeeding. ?Pregravid weight 116 lb (52.6 kg) Total Weight Gain 31 lb (14.1 kg) ?Urinalysis: Urine Protein Negative  Urine Glucose Negative ? ?Fetal Status: Fetal Heart Rate (bpm): 140 Fundal Height: 30 cm Movement: Present    ? ?General:  Alert, oriented and cooperative. Patient is in no acute distress.  ?Skin: Skin is warm and dry. No rash noted.   ?Cardiovascular: Normal heart rate noted  ?Respiratory: Normal respiratory effort, no problems with respiration noted  ?Abdomen: Soft, gravid, appropriate for gestational age. Pain/Pressure: Absent     ?Pelvic:  Cervical exam deferred        ?Extremities: Normal range of motion.  Edema: None  ?Mental Status: Normal mood and affect. Normal behavior.  Normal judgment and thought content.  ? ?Assessment  ? ?26 y.o. G2P1001 at [redacted]w[redacted]d by  09/11/2021, by Ultrasound presenting for routine prenatal visit ? ?Plan  ? ?pregnancy 2 Problems (from 01/29/21 to present)   ? ? Problem Noted Resolved  ? Anemia in pregnancy, third trimester 06/27/2021 by Mirna Mires, CNM No  ? Overview Signed 06/27/2021  5:15 PM by Mirna Mires, CNM  ?  Low H and h noted at her 28 wk labs. ?Note sent via My Chart for pateint to begin on daily iron tablets. ?Plan to recheck her CBC at 32 weeks and refer to hematology if it remains low. ?  ?  ? Supervision of other normal pregnancy, antepartum 01/29/2021 by Natale Milch, MD No  ? Overview Addendum 07/09/2021 10:05 AM by Ellwood Sayers, CNM  ?   ?Nursing Staff Provider  ?Office Location  [facility] Dating  [redacted]w[redacted]d 02/12/21  ?Language  English Anatomy US  Normal anatomy-   ?Flu Vaccine  Declines Genetic Screen  NIPS:   ?TDaP vaccine   07/09/21 Hgb A1C or  ?GTT Early : ?Third trimester : 128  ?Covid Unvaccinated ?covid in 2021   LAB RESULTS   ?Rhogam  na Blood Type O/Positive/-- (11/04 1126)   ?Feeding Plan Formula  Antibody Negative (11/04 1126)  ?Contraception  Rubella 1.48 (11/04 1126)  ?Circumcision NA RPR Non Reactive (11/04 1126)   ?Pediatrician   HBsAg Negative (11/04 1126)   ?Support Person Kai Levins  HIV Non Reactive (11/04 1126)  ?Prenatal Classes multip  Varicella Immune   ?  GBS  (For PCN allergy, check sensitivities)   ?BTL Consent     ?VBAC Consent  Pap  2022  ?  Hgb Electro    ?  CF   ?   SMA   ?     ? ? ?  ?  ? ?  ?  ? ?  Preterm labor symptoms and general obstetric precautions including but not limited to vaginal bleeding, contractions, leaking of fluid and fetal movement were reviewed in detail with the patient. ?Please refer to After Visit Summary for other counseling recommendations.  ? ?Return in about 2 weeks (around 07/23/2021) for ROB. ? ?TDAP given today  ? ?Carie Caddy, CNM  ?Domingo Pulse, [facility]  ?07/09/21  ?10:54 AM  ? ? ?

## 2021-07-23 ENCOUNTER — Ambulatory Visit (INDEPENDENT_AMBULATORY_CARE_PROVIDER_SITE_OTHER): Payer: Medicaid Other | Admitting: Licensed Practical Nurse

## 2021-07-23 ENCOUNTER — Encounter: Payer: Self-pay | Admitting: Licensed Practical Nurse

## 2021-07-23 VITALS — BP 90/62 | Wt 148.0 lb

## 2021-07-23 DIAGNOSIS — Z3480 Encounter for supervision of other normal pregnancy, unspecified trimester: Secondary | ICD-10-CM

## 2021-07-23 DIAGNOSIS — O26843 Uterine size-date discrepancy, third trimester: Secondary | ICD-10-CM

## 2021-07-23 DIAGNOSIS — Z3A32 32 weeks gestation of pregnancy: Secondary | ICD-10-CM

## 2021-07-23 DIAGNOSIS — M549 Dorsalgia, unspecified: Secondary | ICD-10-CM

## 2021-07-23 DIAGNOSIS — O99891 Other specified diseases and conditions complicating pregnancy: Secondary | ICD-10-CM

## 2021-07-23 LAB — POCT URINALYSIS DIPSTICK OB
Glucose, UA: NEGATIVE
POC,PROTEIN,UA: NEGATIVE

## 2021-07-23 NOTE — Progress Notes (Addendum)
Routine Prenatal Care Visit ? ?Subjective  ?Amy [name] is a 26 y.o. G2P1001 at [redacted]w[redacted]d being seen today for ongoing prenatal care.  She is currently monitored for the following issues for this low-risk pregnancy and has Severe recurrent major depression without psychotic features (HCC); Eating disorder; Generalized anxiety disorder; Supervision of other normal pregnancy, antepartum; and Anemia in pregnancy, third trimester on their problem list.  ?----------------------------------------------------------------------------------- ?Patient reports  sleep disturbances .  Here with partner.  Has started taking [employer] vitamins. Will use same ped as her other child, cannot remember the name.  ?Contractions: Not present. Vag. Bleeding: None.  Movement: Present. Leaking Fluid denies.  ?----------------------------------------------------------------------------------- ?The following portions of the patient's history were reviewed and updated as appropriate: allergies, current medications, past family history, past medical history, past social history, past surgical history and problem list. Problem list updated. ? ?Objective  ?Blood pressure 90/62, weight 148 lb (67.1 kg), last menstrual period 12/20/2020, currently breastfeeding. ?Pregravid weight 116 lb (52.6 kg) Total Weight Gain 32 lb (14.5 kg) ?Urinalysis: Urine Protein    Urine Glucose   ? ?Fetal Status: Fetal Heart Rate (bpm): 144 Fundal Height: 30 cm Movement: Present    ? ?General:  Alert, oriented and cooperative. Patient is in no acute distress.  ?Skin: Skin is warm and dry. No rash noted.   ?Cardiovascular: Normal heart rate noted  ?Respiratory: Normal respiratory effort, no problems with respiration noted  ?Abdomen: Soft, gravid, appropriate for gestational age. Pain/Pressure: Present     ?Pelvic:  Cervical exam deferred        ?Extremities: Normal range of motion.  Edema: None  ?Mental Status: Normal mood and affect. Normal behavior. Normal  judgment and thought content.  ? ?Assessment  ? ?26 y.o. G2P1001 at [redacted]w[redacted]d by  09/11/2021, by Ultrasound presenting for routine prenatal visit ? ?Plan  ? ?pregnancy 2 Problems (from 01/29/21 to present)   ? ? Problem Noted Resolved  ? Anemia in pregnancy, third trimester 06/27/2021 by Mirna Mires, CNM No  ? Overview Signed 06/27/2021  5:15 PM by Mirna Mires, CNM  ?  Low H and h noted at her 28 wk labs. ?Note sent via My Chart for pateint to begin on daily iron tablets. ?Plan to recheck her CBC at 32 weeks and refer to hematology if it remains low. ?  ?  ? Supervision of other normal pregnancy, antepartum 01/29/2021 by Natale Milch, MD No  ? Overview Addendum 07/23/2021 10:08 AM by Ellwood Sayers, CNM  ?   ?Nursing Staff Provider  ?Office Location  [facility] Dating  [redacted]w[redacted]d 02/12/21  ?Language  English Anatomy US  Normal anatomy-   ?Flu Vaccine  Declines Genetic Screen  NIPS:   ?TDaP vaccine   07/09/21 Hgb A1C or  ?GTT Early : ?Third trimester : 128  ?Covid Unvaccinated ?covid in 2021   LAB RESULTS   ?Rhogam  na Blood Type O/Positive/-- (11/04 1126)   ?Feeding Plan Formula  Antibody Negative (11/04 1126)  ?Contraception Depo Rubella 1.48 (11/04 1126)  ?Circumcision NA RPR Non Reactive (11/04 1126)   ?Pediatrician   HBsAg Negative (11/04 1126)   ?Support Person Kai Levins  HIV Non Reactive (11/04 1126)  ?Prenatal Classes multip  Varicella Immune   ?  GBS  (For PCN allergy, check sensitivities)   ?BTL Consent     ?VBAC Consent  Pap  2022  ?  Hgb Electro    ?  CF   ?   SMA   ?     ? ? ?  ?  ? ?  ?  ? ?  Preterm labor symptoms and general obstetric precautions including but not limited to vaginal bleeding, contractions, leaking of fluid and fetal movement were reviewed in detail with the patient. ?Please refer to After Visit Summary for other counseling recommendations.  ? ?Return in about 2 weeks (around 08/06/2021) for ROB. ? ?Growth [place] ordered (fundal height unchanged) ? ?Referral to chiro for low back pain   ? ?Carie Caddy, CNM  ?Domingo Pulse, [facility]  ?07/23/21  ?10:10 AM  ? ? ?

## 2021-07-23 NOTE — Addendum Note (Signed)
Addended by: Burtis Junes on: 07/23/2021 10:24 AM ? ? Modules accepted: Orders ? ?

## 2021-07-23 NOTE — Addendum Note (Signed)
Addended byRoberto Scales on: 07/23/2021 10:47 AM ? ? Modules accepted: Orders ? ?

## 2021-07-26 DIAGNOSIS — Z419 Encounter for procedure for purposes other than remedying health state, unspecified: Secondary | ICD-10-CM | POA: Diagnosis not present

## 2021-08-04 ENCOUNTER — Ambulatory Visit (INDEPENDENT_AMBULATORY_CARE_PROVIDER_SITE_OTHER): Payer: Medicaid Other | Admitting: Obstetrics

## 2021-08-04 VITALS — BP 90/60 | Wt 150.0 lb

## 2021-08-04 DIAGNOSIS — Z3A34 34 weeks gestation of pregnancy: Secondary | ICD-10-CM

## 2021-08-04 DIAGNOSIS — Z3480 Encounter for supervision of other normal pregnancy, unspecified trimester: Secondary | ICD-10-CM | POA: Diagnosis not present

## 2021-08-04 NOTE — Progress Notes (Signed)
Routine Prenatal Care Visit ? ?Subjective  ?Amy [name] is a 26 y.o. G2P1001 at [redacted]w[redacted]d being seen today for ongoing prenatal care.  She is currently monitored for the following issues for this high-risk pregnancy and has Severe recurrent major depression without psychotic features ([place]); Eating disorder; Generalized anxiety disorder; Supervision of other normal pregnancy, antepartum; and Anemia in pregnancy, third trimester on their problem list.  ?----------------------------------------------------------------------------------- ?Patient reports no complaints.   ? .  .   . Leaking Fluid denies.  ?----------------------------------------------------------------------------------- ?The following portions of the patient's history were reviewed and updated as appropriate: allergies, current medications, past family history, past medical history, past social history, past surgical history and problem list. Problem list updated. ? ?Objective  ?Blood pressure 90/60, weight 150 lb (68 kg), last menstrual period 12/20/2020, currently breastfeeding. ?Pregravid weight 116 lb (52.6 kg) Total Weight Gain 34 lb (15.4 kg) ?Urinalysis: Urine Protein    Urine Glucose   ? ?Fetal Status:          ? ?General:  Alert, oriented and cooperative. Patient is in no acute distress.  ?Skin: Skin is warm and dry. No rash noted.   ?Cardiovascular: Normal heart rate noted  ?Respiratory: Normal respiratory effort, no problems with respiration noted  ?Abdomen: Soft, gravid, appropriate for gestational age.       ?Pelvic:  Cervical exam deferred        ?Extremities: Normal range of motion.     ?Mental Status: Normal mood and affect. Normal behavior. Normal judgment and thought content.  ? ?Assessment  ? ?26 y.o. G2P1001 at [redacted]w[redacted]d by  09/11/2021, by Ultrasound presenting for routine prenatal visit ? ?Plan  ? ?pregnancy 2 Problems (from 01/29/21 to present)   ? Problem Noted Resolved  ? Anemia in pregnancy, third trimester 06/27/2021 by  Imagene Riches, CNM No  ? Overview Signed 06/27/2021  5:15 PM by Imagene Riches, CNM  ?  Low H and h noted at her 28 wk labs. ?Note sent via My Chart for pateint to begin on daily iron tablets. ?Plan to recheck her CBC at 32 weeks and refer to hematology if it remains low. ?  ?  ? Supervision of other normal pregnancy, antepartum 01/29/2021 by Homero Fellers, MD No  ? Overview Addendum 07/23/2021 10:08 AM by Allen Derry, CNM  ?   ?Nursing Staff Provider  ?Office Location  [facility] Dating  [redacted]w[redacted]d 02/12/21  ?Language  English Anatomy US  Normal anatomy-   ?Flu Vaccine  Declines Genetic Screen  NIPS:   ?TDaP vaccine   07/09/21 Hgb A1C or  ?GTT Early : ?Third trimester : 128  ?Covid Unvaccinated ?covid in 2021   LAB RESULTS   ?Rhogam  na Blood Type O/Positive/-- (11/04 1126)   ?Feeding Plan Formula  Antibody Negative (11/04 1126)  ?Contraception Depo Rubella 1.48 (11/04 1126)  ?Circumcision NA RPR Non Reactive (11/04 1126)   ?Pediatrician   HBsAg Negative (11/04 1126)   ?Support Person Hurley Cisco  HIV Non Reactive (11/04 1126)  ?Prenatal Classes multip  Varicella Immune   ?  GBS  (For PCN allergy, check sensitivities)   ?BTL Consent     ?VBAC Consent  Pap  2022  ?  Hgb Electro    ?  CF   ?   SMA   ?     ? ? ?  ?  ?  ?  ? ?Preterm labor symptoms and general obstetric precautions including but not limited to vaginal bleeding, contractions,  leaking of fluid and fetal movement were reviewed in detail with the patient. ?Please refer to After Visit Summary for other counseling recommendations.  ? ?No follow-ups on file. ? ?Imagene Riches, CNM  ?08/04/2021 4:18 PM   ? ?

## 2021-08-04 NOTE — Progress Notes (Signed)
ROB- no concerns 

## 2021-08-05 LAB — CBC WITH DIFFERENTIAL
Basophils Absolute: 0 10*3/uL (ref 0.0–0.2)
Basos: 0 %
EOS (ABSOLUTE): 0.2 10*3/uL (ref 0.0–0.4)
Eos: 2 %
Hematocrit: 26.5 % — ABNORMAL LOW (ref 34.0–46.6)
Hemoglobin: 8.4 g/dL — ABNORMAL LOW (ref 11.1–15.9)
Immature Grans (Abs): 0.1 10*3/uL (ref 0.0–0.1)
Immature Granulocytes: 1 %
Lymphocytes Absolute: 1.9 10*3/uL (ref 0.7–3.1)
Lymphs: 21 %
MCH: 23.8 pg — ABNORMAL LOW (ref 26.6–33.0)
MCHC: 31.7 g/dL (ref 31.5–35.7)
MCV: 75 fL — ABNORMAL LOW (ref 79–97)
Monocytes Absolute: 0.5 10*3/uL (ref 0.1–0.9)
Monocytes: 6 %
Neutrophils Absolute: 6.5 10*3/uL (ref 1.4–7.0)
Neutrophils: 70 %
RBC: 3.53 x10E6/uL — ABNORMAL LOW (ref 3.77–5.28)
RDW: 15 % (ref 11.7–15.4)
WBC: 9.2 10*3/uL (ref 3.4–10.8)

## 2021-08-10 ENCOUNTER — Ambulatory Visit
Admission: RE | Admit: 2021-08-10 | Discharge: 2021-08-10 | Disposition: A | Discharge status: Home / Self Care | Payer: Medicaid Other | Source: Ambulatory Visit | Attending: Licensed Practical Nurse | Admitting: Licensed Practical Nurse

## 2021-08-10 DIAGNOSIS — O26843 Uterine size-date discrepancy, third trimester: Secondary | ICD-10-CM | POA: Insufficient documentation

## 2021-08-10 DIAGNOSIS — Z3687 Encounter for antenatal screening for uncertain dates: Secondary | ICD-10-CM | POA: Diagnosis not present

## 2021-08-10 DIAGNOSIS — Z3A34 34 weeks gestation of pregnancy: Secondary | ICD-10-CM | POA: Diagnosis not present

## 2021-08-10 DIAGNOSIS — Z3480 Encounter for supervision of other normal pregnancy, unspecified trimester: Secondary | ICD-10-CM | POA: Insufficient documentation

## 2021-08-11 ENCOUNTER — Other Ambulatory Visit: Payer: Self-pay | Admitting: Obstetrics

## 2021-08-11 ENCOUNTER — Encounter: Payer: Self-pay | Admitting: Obstetrics

## 2021-08-11 DIAGNOSIS — Z3480 Encounter for supervision of other normal pregnancy, unspecified trimester: Secondary | ICD-10-CM

## 2021-08-11 DIAGNOSIS — O99013 Anemia complicating pregnancy, third trimester: Secondary | ICD-10-CM

## 2021-08-11 MED ORDER — FE FUM-VIT C-VIT B12-FA 200-250-0.01-1 MG PO CAPS: 1.0000 | ORAL_CAPSULE | Freq: Every day | ORAL | 2 refills | Status: AC

## 2021-08-16 ENCOUNTER — Ambulatory Visit (INDEPENDENT_AMBULATORY_CARE_PROVIDER_SITE_OTHER): Payer: Medicaid Other | Admitting: Advanced Practice Midwife

## 2021-08-16 ENCOUNTER — Encounter: Payer: Self-pay | Admitting: Advanced Practice Midwife

## 2021-08-16 VITALS — BP 120/80 | Wt 149.0 lb

## 2021-08-16 DIAGNOSIS — Z369 Encounter for antenatal screening, unspecified: Secondary | ICD-10-CM

## 2021-08-16 DIAGNOSIS — Z3685 Encounter for antenatal screening for Streptococcus B: Secondary | ICD-10-CM

## 2021-08-16 DIAGNOSIS — Z3A36 36 weeks gestation of pregnancy: Secondary | ICD-10-CM | POA: Insufficient documentation

## 2021-08-16 DIAGNOSIS — Z3480 Encounter for supervision of other normal pregnancy, unspecified trimester: Secondary | ICD-10-CM | POA: Diagnosis not present

## 2021-08-16 DIAGNOSIS — Z113 Encounter for screening for infections with a predominantly sexual mode of transmission: Secondary | ICD-10-CM

## 2021-08-16 LAB — POCT URINALYSIS DIPSTICK OB
Glucose, UA: NEGATIVE
POC,PROTEIN,UA: NEGATIVE

## 2021-08-16 NOTE — Progress Notes (Signed)
Routine Prenatal Care Visit  Subjective  Amy [name] is a 26 y.o. G2P1001 at [redacted]w[redacted]d being seen today for ongoing prenatal care.  She is currently monitored for the following issues for this low-risk pregnancy and has Severe recurrent major depression without psychotic features (HCC); Eating disorder; Generalized anxiety disorder; Supervision of other normal pregnancy, antepartum; Anemia in pregnancy, third trimester; and [redacted] weeks gestation of pregnancy on their problem list.  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 Comfort measures reviewed.   Contractions: Not present. Vag. Bleeding: None.  Movement: Present. Leaking Fluid denies.  ----------------------------------------------------------------------------------- The following portions of the patient's history were reviewed and updated as appropriate: allergies, current medications, past family history, past medical history, past social history, past surgical history and problem list. Problem list updated.  Objective  Blood pressure 120/80, weight 149 lb (67.6 kg), last menstrual period 12/20/2020 Pregravid weight 116 lb (52.6 kg) Total Weight Gain 33 lb (15 kg) Urinalysis: Urine Protein Negative  Urine Glucose Negative  Fetal Status: Fetal Heart Rate (bpm): 143 Fundal Height: 36 cm Movement: Present     General:  Alert, oriented and cooperative. Patient is in no acute distress.  Skin: Skin is warm and dry. No rash noted.   Cardiovascular: Normal heart rate noted  Respiratory: Normal respiratory effort, no problems with respiration noted  Abdomen: Soft, gravid, appropriate for gestational age. Pain/Pressure: Present     Pelvic:  Cervical exam deferred      GBS/aptima collected  Extremities: Normal range of motion.  Edema: None  Mental Status: Normal mood and affect. Normal behavior. Normal judgment and thought  content.   Assessment   26 y.o. G2P1001 at [redacted]w[redacted]d by  09/11/2021, by Ultrasound presenting for routine prenatal visit  Plan   pregnancy 2 Problems (from 01/29/21 to present)    Problem Noted Resolved   Anemia in pregnancy, third trimester 06/27/2021 by Mirna Mires, CNM No   Overview Addendum 08/11/2021  5:18 PM by Mirna Mires, CNM    Low H and h noted at her 28 wk labs. Note sent via My Chart for pateint to begin on daily iron tablets. Plan to recheck her CBC at 32 weeks and refer to hematology if it remains low. H and H at 34 weeks is 8.6/24. Attempted to contact pt by phone- no VM.referral to Hematology made      Supervision of other normal pregnancy, antepartum 01/29/2021 by Natale Milch, MD No   Overview Addendum 07/23/2021 10:08 AM by Ellwood Sayers, CNM     Nursing Staff Provider  Office Location  [facility] Dating  [redacted]w[redacted]d 02/12/21  Language  English Anatomy US  Normal anatomy-   Flu Vaccine  Declines Genetic Screen  NIPS:   TDaP vaccine   07/09/21 Hgb A1C or  GTT Early : Third trimester : 128  Covid Unvaccinated covid in 2021   LAB RESULTS   Rhogam  na Blood Type O/Positive/-- (11/04 1126)   Feeding Plan Formula  Antibody Negative (11/04 1126)  Contraception Depo Rubella 1.48 (11/04 1126)  Circumcision NA RPR Non Reactive (11/04 1126)   Pediatrician   HBsAg Negative (11/04 1126)   Support Person Kai Levins  HIV Non Reactive (11/04 1126)  Prenatal Classes multip  Varicella Immune     GBS  (For PCN allergy, check sensitivities)   BTL Consent     VBAC Consent  Pap  2022    Hgb Electro      CF  SMA                   Preterm labor symptoms and general obstetric precautions including but not limited to vaginal bleeding, contractions, leaking of fluid and fetal movement were reviewed in detail with the patient. Please refer to After Visit Summary for other counseling recommendations.   Return in about 1 week (around 08/23/2021) for rob.  Tresea Mall, CNM 08/16/2021 9:34 AM

## 2021-08-18 LAB — STREP GP B NAA: Strep Gp B NAA: NEGATIVE

## 2021-08-19 LAB — GC/CHLAMYDIA PROBE AMP
Chlamydia trachomatis, NAA: NEGATIVE
Neisseria Gonorrhoeae by PCR: NEGATIVE

## 2021-08-20 ENCOUNTER — Inpatient Hospital Stay: Payer: Medicaid Other

## 2021-08-20 ENCOUNTER — Inpatient Hospital Stay: Payer: Medicaid Other | Attending: Oncology | Admitting: Oncology

## 2021-08-20 ENCOUNTER — Encounter: Payer: Self-pay | Admitting: Oncology

## 2021-08-20 VITALS — BP 104/62 | HR 84 | Temp 97.3°F | Resp 18 | Ht 68.0 in | Wt 156.2 lb

## 2021-08-20 DIAGNOSIS — D508 Other iron deficiency anemias: Secondary | ICD-10-CM | POA: Diagnosis not present

## 2021-08-20 DIAGNOSIS — R5383 Other fatigue: Secondary | ICD-10-CM | POA: Insufficient documentation

## 2021-08-20 DIAGNOSIS — Z3A34 34 weeks gestation of pregnancy: Secondary | ICD-10-CM | POA: Insufficient documentation

## 2021-08-20 DIAGNOSIS — Z803 Family history of malignant neoplasm of breast: Secondary | ICD-10-CM | POA: Insufficient documentation

## 2021-08-20 DIAGNOSIS — O99013 Anemia complicating pregnancy, third trimester: Secondary | ICD-10-CM | POA: Diagnosis not present

## 2021-08-20 DIAGNOSIS — Z87891 Personal history of nicotine dependence: Secondary | ICD-10-CM | POA: Diagnosis not present

## 2021-08-20 DIAGNOSIS — J45909 Unspecified asthma, uncomplicated: Secondary | ICD-10-CM | POA: Insufficient documentation

## 2021-08-20 DIAGNOSIS — Z818 Family history of other mental and behavioral disorders: Secondary | ICD-10-CM | POA: Insufficient documentation

## 2021-08-20 DIAGNOSIS — Z833 Family history of diabetes mellitus: Secondary | ICD-10-CM | POA: Insufficient documentation

## 2021-08-20 DIAGNOSIS — Z79899 Other long term (current) drug therapy: Secondary | ICD-10-CM | POA: Insufficient documentation

## 2021-08-20 LAB — CBC WITH DIFFERENTIAL/PLATELET
Abs Immature Granulocytes: 0.05 10*3/uL (ref 0.00–0.07)
Basophils Absolute: 0 10*3/uL (ref 0.0–0.1)
Basophils Relative: 1 %
Eosinophils Absolute: 0.2 10*3/uL (ref 0.0–0.5)
Eosinophils Relative: 2 %
HCT: 27 % — ABNORMAL LOW (ref 36.0–46.0)
Hemoglobin: 8.1 g/dL — ABNORMAL LOW (ref 12.0–15.0)
Immature Granulocytes: 1 %
Lymphocytes Relative: 20 %
Lymphs Abs: 1.6 10*3/uL (ref 0.7–4.0)
MCH: 22.6 pg — ABNORMAL LOW (ref 26.0–34.0)
MCHC: 30 g/dL (ref 30.0–36.0)
MCV: 75.4 fL — ABNORMAL LOW (ref 80.0–100.0)
Monocytes Absolute: 0.5 10*3/uL (ref 0.1–1.0)
Monocytes Relative: 6 %
Neutro Abs: 5.9 10*3/uL (ref 1.7–7.7)
Neutrophils Relative %: 70 %
Platelets: 320 10*3/uL (ref 150–400)
RBC: 3.58 MIL/uL — ABNORMAL LOW (ref 3.87–5.11)
RDW: 15.9 % — ABNORMAL HIGH (ref 11.5–15.5)
WBC: 8.3 10*3/uL (ref 4.0–10.5)
nRBC: 0 % (ref 0.0–0.2)

## 2021-08-20 LAB — IRON AND TIBC
Iron: 15 ug/dL — ABNORMAL LOW (ref 28–170)
Saturation Ratios: 2 % — ABNORMAL LOW (ref 10.4–31.8)
TIBC: 713 ug/dL — ABNORMAL HIGH (ref 250–450)
UIBC: 698 ug/dL

## 2021-08-20 LAB — TECHNOLOGIST SMEAR REVIEW: Plt Morphology: ADEQUATE

## 2021-08-20 LAB — FERRITIN: Ferritin: 3 ng/mL — ABNORMAL LOW (ref 11–307)

## 2021-08-21 ENCOUNTER — Encounter: Payer: Self-pay | Admitting: Oncology

## 2021-08-21 DIAGNOSIS — D509 Iron deficiency anemia, unspecified: Secondary | ICD-10-CM | POA: Insufficient documentation

## 2021-08-21 NOTE — Progress Notes (Signed)
Hematology/Oncology Consult note [phone] [phone]         Patient Care Team: Hubbard Hartshorn, FNP as PCP - General (Family Medicine)  REFERRING PROVIDER: Imagene Riches, CNM  CHIEF COMPLAINTS/REASON FOR VISIT:  Evaluation of anemia in pregnancy.  HISTORY OF PRESENTING ILLNESS:   Amy [name] is a  26 y.o.  female with PMH listed below was seen in consultation at the request of  Imagene Riches, CNM  for evaluation of anemia in pregnancy.  Patient is currently in her third trimester pregnancy  08/04/2021, CBC showed a hemoglobin of 8.4, MCV 75.  Normal white count and normal platelet counts. Patient reports that she has history of iron deficiency, previously she has tried oral iron supplementation.  Hemoglobin decreases despite being on iron supplementation. + Fatigue, denies shortness of breath, chest pain, abdominal pain.  Denies any black stool or bloody stool.   Review of Systems  Constitutional:  Positive for fatigue. Negative for appetite change, chills and fever.  HENT:   Negative for hearing loss and voice change.   Eyes:  Negative for eye problems.  Respiratory:  Negative for chest tightness and cough.   Cardiovascular:  Negative for chest pain.  Gastrointestinal:  Negative for abdominal distention, abdominal pain and blood in stool.  Endocrine: Negative for hot flashes.  Genitourinary:  Negative for difficulty urinating and frequency.   Musculoskeletal:  Negative for arthralgias.  Skin:  Negative for itching and rash.  Neurological:  Negative for extremity weakness.  Hematological:  Negative for adenopathy.  Psychiatric/Behavioral:  Negative for confusion.    MEDICAL HISTORY:  Past Medical History:  Diagnosis Date   Anxiety    Asthma    pt states she has not taken medications for this since age 69   Depression     SURGICAL HISTORY: Past Surgical History:  Procedure Laterality Date   WISDOM TOOTH EXTRACTION       SOCIAL HISTORY: Social History   Socioeconomic History   Marital status: Single    Spouse name: Not on file   Number of children: 1   Years of education: Not on file   Highest education level: 11th grade  Occupational History   Occupation: unemployed  Tobacco Use   Smoking status: Former    Packs/day: 0.25    Years: 1.00    Pack years: 0.25    Types: E-cigarettes, Cigarettes    Start date: 03/26/2017    Quit date: 04/04/2017    Years since quitting: 4.3   Smokeless tobacco: Never  Vaping Use   Vaping Use: Former  Substance and Sexual Activity   Alcohol use: Not Currently   Drug use: Not Currently    Types: Marijuana    Comment: had +MJ screen at [facility]   Sexual activity: Yes    Partners: Female    Birth control/protection: Injection  Other Topics Concern   Not on file  Social History Narrative   Lives with adoptive father and her boyfriend; baby boy is due 10/30/2017      Mom and older sister live in [place] and she does have periodic contact with them.   Social Determinants of Health   Financial Resource Strain: Not on file  Food Insecurity: Not on file  Transportation Needs: Not on file  Physical Activity: Not on file  Stress: Not on file  Social Connections: Not on file  Intimate Partner Violence: Not on file    FAMILY HISTORY: Family History  Adopted: Yes  Problem Relation Age of Onset   Depression Mother    Depression Sister    Premature birth Sister    Depression Sister    Depression Brother    Anxiety disorder Brother    Breast cancer Maternal Grandmother    Diabetes Mellitus II Maternal Grandmother     ALLERGIES:  has No Known Allergies.  MEDICATIONS:  Current Outpatient Medications  Medication Sig Dispense Refill   Fe Fum-Vit C-Vit B12-FA 200-250-0.01-1 MG CAPS Take 1 capsule by mouth daily at 8 pm. 30 capsule 2   Prenatal Vit-Fe Fumarate-FA (MULTIVITAMIN-PRENATAL) 27-0.8 MG TABS tablet Take 1 tablet by mouth daily at 12 noon.     No current  facility-administered medications for this visit.     PHYSICAL EXAMINATION:  Vitals:   08/20/21 1127  BP: 104/62  Pulse: 84  Resp: 18  Temp: (!) 97.3 F (36.3 C)   Filed Weights   08/20/21 1127  Weight: 156 lb 3.2 oz (70.9 kg)    Physical Exam Constitutional:      General: She is not in acute distress. HENT:     Head: Normocephalic and atraumatic.  Eyes:     General: No scleral icterus. Cardiovascular:     Rate and Rhythm: Normal rate and regular rhythm.     Heart sounds: Normal heart sounds.  Pulmonary:     Effort: Pulmonary effort is normal. No respiratory distress.     Breath sounds: No wheezing.  Abdominal:     Comments: Gravid uterus  Musculoskeletal:        General: No deformity. Normal range of motion.     Cervical back: Normal range of motion and neck supple.  Skin:    General: Skin is warm and dry.     Coloration: Skin is pale.     Findings: No erythema or rash.  Neurological:     Mental Status: She is alert and oriented to person, place, and time. Mental status is at baseline.     Cranial Nerves: No cranial nerve deficit.     Coordination: Coordination normal.  Psychiatric:        Mood and Affect: Mood normal.    LABORATORY DATA:  I have reviewed the data as listed Lab Results  Component Value Date   WBC 8.3 08/20/2021   HGB 8.1 (L) 08/20/2021   HCT 27.0 (L) 08/20/2021   MCV 75.4 (L) 08/20/2021   PLT 320 08/20/2021   No results for input(s): NA, K, CL, CO2, GLUCOSE, BUN, CREATININE, CALCIUM, GFRNONAA, GFRAA, PROT, ALBUMIN, AST, ALT, ALKPHOS, BILITOT, BILIDIR, IBILI in the last 8760 hours. Iron/TIBC/Ferritin/ %Sat    Component Value Date/Time   IRON 15 (L) 08/20/2021 1156   TIBC 713 (H) 08/20/2021 1156   FERRITIN 3 (L) 08/20/2021 1156   IRONPCTSAT 2 (L) 08/20/2021 1156      RADIOGRAPHIC STUDIES: I have personally reviewed the radiological images as listed and agreed with the findings in the report. US OB Follow Up  Result Date:  08/10/2021 CLINICAL DATA:  Size less than dates EXAM: OBSTETRIC 14+ WK ULTRASOUND FOLLOW-UP FINDINGS: Number of Fetuses: 1 Heart Rate:  130 bpm Movement: Yes Presentation: Cephalic Previa: No Placental Location: Posterior Amniotic Fluid (Subjective): Normal Amniotic Fluid (Objective): AFI 8.6 cm (5%ile= 7.9 cm, 95%= 24.9 cm for 35 wks) FETAL BIOMETRY BPD:  8.4cm 33w 5d HC:    30.8cm 34w 2d AC:    31.4cm 35w 2d FL:    6.7cm 34w 3d Current Mean GA: 34w 0d [place] EDC:  09/21/2021 Assigned GA: 35w 3d Assigned EDC: 09/11/2021 Estimated Fetal Weight:  2,517g 30.8%ile FETAL ANATOMY Complete fetal anatomic survey was done previously. Lateral ventricles, thalami, stomach, kidneys, and bladder are well visualized on this exam and unremarkable. Technical Limitations: None Maternal Findings: Cervix:  Not evaluated IMPRESSION: 1. Single live intrauterine pregnancy as above, estimated age 37 weeks and 0 days based on today's measurements. 2. Estimated fetal weight at the 30.8 %ile. 3. No fetal anomalies identified. Complete anatomic survey was performed previously. Electronically Signed   By: Randa Ngo M.D.   On: 08/10/2021 15:34      ASSESSMENT & PLAN:  1. Anemia in pregnancy, third trimester   2. Other iron deficiency anemia    #Iron deficiency anemia, anemia during third trimester. Check CBC, iron TIBC ferritin Patient has tried oral iron supplementation with hemoglobin further decreases.  Lab results are consistent with iron deficiency.  Patient is interested in IV Venofer treatments. Plan IV iron with Venofer 255m  x 4 doses. Risks of infusion reactions including anaphylactic reactions were discussed with patient. Other side effects include but not limited to high blood pressure, headache,wheezing, SOB, skin rash and itchiness, weight gain, leg swelling, etc. Patient voices understanding and is willing to proceed.  She will follow-up in 3 months.  Orders Placed This Encounter  Procedures   Multiple Myeloma  Panel (SPEP&IFE w/QIG)    Standing Status:   Future    Number of Occurrences:   1    Standing Expiration Date:   08/21/2022   CBC with Differential/Platelet    Standing Status:   Future    Number of Occurrences:   1    Standing Expiration Date:   08/21/2022   Technologist smear review    Standing Status:   Future    Number of Occurrences:   1    Standing Expiration Date:   08/21/2022   Ferritin    Standing Status:   Future    Number of Occurrences:   1    Standing Expiration Date:   02/20/2022   Iron and TIBC    Standing Status:   Future    Number of Occurrences:   1    Standing Expiration Date:   08/21/2022    All questions were answered. The patient knows to call the clinic with any problems questions or concerns.  cc FImagene Riches CNM    Return of visit: 3 months Thank you for this kind referral and the opportunity to participate in the care of this patient. A copy of today's note is routed to referring provider   ZEarlie Server MD, PhD [facility] 08/21/2021

## 2021-08-21 NOTE — Addendum Note (Signed)
Addended by: Rickard Patience on: 08/21/2021 11:41 AM   Modules accepted: Orders

## 2021-08-24 ENCOUNTER — Telehealth: Payer: Self-pay | Admitting: Oncology

## 2021-08-24 ENCOUNTER — Telehealth: Payer: Self-pay

## 2021-08-24 NOTE — Telephone Encounter (Signed)
-----   Message from Rickard Patience, MD sent at 08/21/2021 11:46 AM EDT ----- Please keep venofer infusion on 6/1 Add venofer x 2 during week of 6/5, 3 days apart,  1 venofer during week of 6/12 Keep followup appt the same. Thanks.

## 2021-08-24 NOTE — Telephone Encounter (Signed)
Please schedule 2 additional venofer week of 6/5 (3 days apart) Venofer week of 6/12. Keep follow up appt the dame.  Please inform patient of new appt. Thanks

## 2021-08-24 NOTE — Telephone Encounter (Signed)
Called pt to inform her of new appt add on's, Unable to LVM for pt

## 2021-08-25 ENCOUNTER — Encounter: Payer: Medicaid Other | Admitting: Obstetrics

## 2021-08-26 ENCOUNTER — Inpatient Hospital Stay: Payer: Medicaid Other | Attending: Oncology

## 2021-08-26 VITALS — BP 114/62 | HR 70 | Temp 97.4°F | Resp 18

## 2021-08-26 DIAGNOSIS — Z87891 Personal history of nicotine dependence: Secondary | ICD-10-CM | POA: Insufficient documentation

## 2021-08-26 DIAGNOSIS — Z803 Family history of malignant neoplasm of breast: Secondary | ICD-10-CM | POA: Insufficient documentation

## 2021-08-26 DIAGNOSIS — E611 Iron deficiency: Secondary | ICD-10-CM | POA: Diagnosis not present

## 2021-08-26 DIAGNOSIS — Z419 Encounter for procedure for purposes other than remedying health state, unspecified: Secondary | ICD-10-CM | POA: Diagnosis not present

## 2021-08-26 DIAGNOSIS — R5383 Other fatigue: Secondary | ICD-10-CM | POA: Diagnosis not present

## 2021-08-26 DIAGNOSIS — Z818 Family history of other mental and behavioral disorders: Secondary | ICD-10-CM | POA: Diagnosis not present

## 2021-08-26 DIAGNOSIS — Z3A00 Weeks of gestation of pregnancy not specified: Secondary | ICD-10-CM | POA: Insufficient documentation

## 2021-08-26 DIAGNOSIS — Z833 Family history of diabetes mellitus: Secondary | ICD-10-CM | POA: Insufficient documentation

## 2021-08-26 DIAGNOSIS — Z79899 Other long term (current) drug therapy: Secondary | ICD-10-CM | POA: Insufficient documentation

## 2021-08-26 DIAGNOSIS — O99013 Anemia complicating pregnancy, third trimester: Secondary | ICD-10-CM | POA: Diagnosis not present

## 2021-08-26 DIAGNOSIS — D508 Other iron deficiency anemias: Secondary | ICD-10-CM

## 2021-08-26 LAB — MULTIPLE MYELOMA PANEL, SERUM
Albumin SerPl Elph-Mcnc: 3 g/dL (ref 2.9–4.4)
Albumin/Glob SerPl: 1.1 (ref 0.7–1.7)
Alpha 1: 0.3 g/dL (ref 0.0–0.4)
Alpha2 Glob SerPl Elph-Mcnc: 0.7 g/dL (ref 0.4–1.0)
B-Globulin SerPl Elph-Mcnc: 1.2 g/dL (ref 0.7–1.3)
Gamma Glob SerPl Elph-Mcnc: 0.8 g/dL (ref 0.4–1.8)
Globulin, Total: 3 g/dL (ref 2.2–3.9)
IgA: 143 mg/dL (ref 87–352)
IgG (Immunoglobin G), Serum: 798 mg/dL (ref 586–1602)
IgM (Immunoglobulin M), Srm: 201 mg/dL (ref 26–217)
Total Protein ELP: 6 g/dL (ref 6.0–8.5)

## 2021-08-26 MED ORDER — SODIUM CHLORIDE 0.9 % IV SOLN: 200.0000 mg | Freq: Once | INTRAVENOUS | Status: DC

## 2021-08-26 MED ORDER — SODIUM CHLORIDE 0.9 % IV SOLN
Freq: Once | INTRAVENOUS | Status: AC
  Filled 2021-08-26: qty 250

## 2021-08-26 MED ORDER — IRON SUCROSE 20 MG/ML IV SOLN
200.0000 mg | Freq: Once | INTRAVENOUS | Status: AC
  Administered 2021-08-26: 200 mg via INTRAVENOUS
  Filled 2021-08-26: qty 10

## 2021-08-26 NOTE — Patient Instructions (Signed)
[facility]  Discharge Instructions: ?Thank you for choosing [facility] to provide your oncology and hematology care.  ?If you have a lab appointment with the Cancer Center, please go directly to the Cancer Center and check in at the registration area. ? ?Wear comfortable clothing and clothing appropriate for easy access to any Portacath or PICC line.  ? ?We strive to give you quality time with your provider. You may need to reschedule your appointment if you arrive late (15 or more minutes).  Arriving late affects you and other patients whose appointments are after yours.  Also, if you miss three or more appointments without notifying the office, you may be dismissed from the clinic at the provider?s discretion.    ?  ?For prescription refill requests, have your pharmacy contact our office and allow 72 hours for refills to be completed.   ? ?Today you received the following chemotherapy and/or immunotherapy agents VENOFER ?    ?  ?To help prevent nausea and vomiting after your treatment, we encourage you to take your nausea medication as directed. ? ?BELOW ARE SYMPTOMS THAT SHOULD BE REPORTED IMMEDIATELY: ?*FEVER GREATER THAN 100.4 F (38 ?C) OR HIGHER ?*CHILLS OR SWEATING ?*NAUSEA AND VOMITING THAT IS NOT CONTROLLED WITH YOUR NAUSEA MEDICATION ?*UNUSUAL SHORTNESS OF BREATH ?*UNUSUAL BRUISING OR BLEEDING ?*URINARY PROBLEMS (pain or burning when urinating, or frequent urination) ?*BOWEL PROBLEMS (unusual diarrhea, constipation, pain near the anus) ?TENDERNESS IN MOUTH AND THROAT WITH OR WITHOUT PRESENCE OF ULCERS (sore throat, sores in mouth, or a toothache) ?UNUSUAL RASH, SWELLING OR PAIN  ?UNUSUAL VAGINAL DISCHARGE OR ITCHING  ? ?Items with * indicate a potential emergency and should be followed up as soon as possible or go to the Emergency Department if any problems should occur. ? ?Please show the CHEMOTHERAPY ALERT CARD or IMMUNOTHERAPY ALERT CARD at check-in to  the Emergency Department and triage nurse. ? ?Should you have questions after your visit or need to cancel or reschedule your appointment, please contact [facility]  [phone] and follow the prompts.  Office hours are 8:00 a.m. to 4:30 p.m. Monday - Friday. Please note that voicemails left after 4:00 p.m. may not be returned until the following business day.  We are closed weekends and major holidays. You have access to a nurse at all times for urgent questions. Please call the main number to the clinic [phone] and follow the prompts. ? ?For any non-urgent questions, you may also contact your provider using MyChart. We now offer e-Visits for anyone 18 and older to request care online for non-urgent symptoms. For details visit [email or web address]. ?  ?Also download the MyChart app! Go to the app store, search "MyChart", open the app, select [place], and log in with your MyChart username and password. ? ?Due to Covid, a mask is required upon entering the hospital/clinic. If you do not have a mask, one will be given to you upon arrival. For doctor visits, patients may have 1 support person aged 18 or older with them. For treatment visits, patients cannot have anyone with them due to current Covid guidelines and our immunocompromised population.  ? ?Iron Sucrose Injection ?What is this medication? ?IRON SUCROSE (EYE ern SOO krose) treats low levels of iron (iron deficiency anemia) in people with kidney disease. Iron is a mineral that plays an important role in making red blood cells, which carry oxygen from your lungs to the rest of your body. ?This medicine   may be used for other purposes; ask your health care provider or pharmacist if you have questions. ?COMMON BRAND NAME(S): Venofer ?What should I tell my care team before I take this medication? ?They need to know if you have any of these conditions: ?Anemia not caused by low iron levels ?Heart disease ?High levels of  iron in the blood ?Kidney disease ?Liver disease ?An unusual or allergic reaction to iron, other medications, foods, dyes, or preservatives ?Pregnant or trying to get pregnant ?Breast-feeding ?How should I use this medication? ?This medication is for infusion into a vein. It is given in a hospital or clinic setting. ?Talk to your care team about the use of this medication in children. While this medication may be prescribed for children as young as 2 years for selected conditions, precautions do apply. ?Overdosage: If you think you have taken too much of this medicine contact a poison control center or emergency room at once. ?NOTE: This medicine is only for you. Do not share this medicine with others. ?What if I miss a dose? ?It is important not to miss your dose. Call your care team if you are unable to keep an appointment. ?What may interact with this medication? ?Do not take this medication with any of the following: ?Deferoxamine ?Dimercaprol ?Other iron products ?This medication may also interact with the following: ?Chloramphenicol ?Deferasirox ?This list may not describe all possible interactions. Give your health care provider a list of all the medicines, herbs, non-prescription drugs, or dietary supplements you use. Also tell them if you smoke, drink alcohol, or use illegal drugs. Some items may interact with your medicine. ?What should I watch for while using this medication? ?Visit your care team regularly. Tell your care team if your symptoms do not start to get better or if they get worse. You may need blood work done while you are taking this medication. ?You may need to follow a special diet. Talk to your care team. Foods that contain iron include: whole grains/cereals, dried fruits, beans, or peas, leafy green vegetables, and organ meats (liver, kidney). ?What side effects may I notice from receiving this medication? ?Side effects that you should report to your care team as soon as  possible: ?Allergic reactions--skin rash, itching, hives, swelling of the face, lips, tongue, or throat ?Low blood pressure--dizziness, feeling faint or lightheaded, blurry vision ?Shortness of breath ?Side effects that usually do not require medical attention (report to your care team if they continue or are bothersome): ?Flushing ?Headache ?Joint pain ?Muscle pain ?Nausea ?Pain, redness, or irritation at injection site ?This list may not describe all possible side effects. Call your doctor for medical advice about side effects. You may report side effects to FDA at 1-800-FDA-1088. ?Where should I keep my medication? ?This medication is given in a hospital or clinic and will not be stored at home. ?NOTE: This sheet is a summary. It may not cover all possible information. If you have questions about this medicine, talk to your doctor, pharmacist, or health care provider. ?? 2023 Elsevier/Gold Standard (2020-08-07 00:00:00) ? ?

## 2021-08-30 ENCOUNTER — Inpatient Hospital Stay: Payer: Medicaid Other

## 2021-08-30 VITALS — BP 115/74 | HR 89 | Temp 98.1°F | Resp 20

## 2021-08-30 DIAGNOSIS — Z833 Family history of diabetes mellitus: Secondary | ICD-10-CM | POA: Diagnosis not present

## 2021-08-30 DIAGNOSIS — Z803 Family history of malignant neoplasm of breast: Secondary | ICD-10-CM | POA: Diagnosis not present

## 2021-08-30 DIAGNOSIS — D508 Other iron deficiency anemias: Secondary | ICD-10-CM

## 2021-08-30 DIAGNOSIS — R5383 Other fatigue: Secondary | ICD-10-CM | POA: Diagnosis not present

## 2021-08-30 DIAGNOSIS — Z87891 Personal history of nicotine dependence: Secondary | ICD-10-CM | POA: Diagnosis not present

## 2021-08-30 DIAGNOSIS — Z818 Family history of other mental and behavioral disorders: Secondary | ICD-10-CM | POA: Diagnosis not present

## 2021-08-30 DIAGNOSIS — O99013 Anemia complicating pregnancy, third trimester: Secondary | ICD-10-CM | POA: Diagnosis not present

## 2021-08-30 DIAGNOSIS — Z79899 Other long term (current) drug therapy: Secondary | ICD-10-CM | POA: Diagnosis not present

## 2021-08-30 DIAGNOSIS — Z3A00 Weeks of gestation of pregnancy not specified: Secondary | ICD-10-CM | POA: Diagnosis not present

## 2021-08-30 DIAGNOSIS — E611 Iron deficiency: Secondary | ICD-10-CM | POA: Diagnosis not present

## 2021-08-30 MED ORDER — IRON SUCROSE 20 MG/ML IV SOLN
200.0000 mg | Freq: Once | INTRAVENOUS | Status: AC
  Administered 2021-08-30: 200 mg via INTRAVENOUS
  Filled 2021-08-30: qty 10

## 2021-08-30 MED ORDER — SODIUM CHLORIDE 0.9 % IV SOLN
Freq: Once | INTRAVENOUS | Status: AC
  Filled 2021-08-30: qty 250

## 2021-08-30 MED ORDER — SODIUM CHLORIDE 0.9 % IV SOLN: 200.0000 mg | Freq: Once | INTRAVENOUS | Status: DC

## 2021-09-01 ENCOUNTER — Encounter: Payer: Medicaid Other | Admitting: Licensed Practical Nurse

## 2021-09-02 ENCOUNTER — Inpatient Hospital Stay: Payer: Medicaid Other

## 2021-09-02 VITALS — BP 116/64 | HR 74 | Temp 97.2°F | Resp 16

## 2021-09-02 DIAGNOSIS — Z818 Family history of other mental and behavioral disorders: Secondary | ICD-10-CM | POA: Diagnosis not present

## 2021-09-02 DIAGNOSIS — Z79899 Other long term (current) drug therapy: Secondary | ICD-10-CM | POA: Diagnosis not present

## 2021-09-02 DIAGNOSIS — Z87891 Personal history of nicotine dependence: Secondary | ICD-10-CM | POA: Diagnosis not present

## 2021-09-02 DIAGNOSIS — E611 Iron deficiency: Secondary | ICD-10-CM | POA: Diagnosis not present

## 2021-09-02 DIAGNOSIS — Z833 Family history of diabetes mellitus: Secondary | ICD-10-CM | POA: Diagnosis not present

## 2021-09-02 DIAGNOSIS — Z3A00 Weeks of gestation of pregnancy not specified: Secondary | ICD-10-CM | POA: Diagnosis not present

## 2021-09-02 DIAGNOSIS — D508 Other iron deficiency anemias: Secondary | ICD-10-CM

## 2021-09-02 DIAGNOSIS — O99013 Anemia complicating pregnancy, third trimester: Secondary | ICD-10-CM | POA: Diagnosis not present

## 2021-09-02 DIAGNOSIS — Z803 Family history of malignant neoplasm of breast: Secondary | ICD-10-CM | POA: Diagnosis not present

## 2021-09-02 DIAGNOSIS — R5383 Other fatigue: Secondary | ICD-10-CM | POA: Diagnosis not present

## 2021-09-02 MED ORDER — SODIUM CHLORIDE 0.9 % IV SOLN
Freq: Once | INTRAVENOUS | Status: AC
  Filled 2021-09-02: qty 250

## 2021-09-02 MED ORDER — SODIUM CHLORIDE 0.9 % IV SOLN: 200.0000 mg | Freq: Once | INTRAVENOUS | Status: DC

## 2021-09-02 MED ORDER — IRON SUCROSE 20 MG/ML IV SOLN
200.0000 mg | Freq: Once | INTRAVENOUS | Status: AC
  Administered 2021-09-02: 200 mg via INTRAVENOUS
  Filled 2021-09-02: qty 10

## 2021-09-03 ENCOUNTER — Ambulatory Visit (INDEPENDENT_AMBULATORY_CARE_PROVIDER_SITE_OTHER): Payer: Medicaid Other | Admitting: Licensed Practical Nurse

## 2021-09-03 ENCOUNTER — Encounter: Payer: Self-pay | Admitting: Licensed Practical Nurse

## 2021-09-03 VITALS — BP 122/60 | Wt 157.0 lb

## 2021-09-03 DIAGNOSIS — Z3480 Encounter for supervision of other normal pregnancy, unspecified trimester: Secondary | ICD-10-CM

## 2021-09-03 DIAGNOSIS — Z3A38 38 weeks gestation of pregnancy: Secondary | ICD-10-CM

## 2021-09-03 LAB — POCT URINALYSIS DIPSTICK OB
Glucose, UA: NEGATIVE
POC,PROTEIN,UA: NEGATIVE

## 2021-09-03 NOTE — Progress Notes (Signed)
Routine Prenatal Care Visit  Subjective  Amy [name] is a 26 y.o. G2P1001 at [redacted]w[redacted]d being seen today for ongoing prenatal care.  She is currently monitored for the following issues for this low-risk pregnancy and has Severe recurrent major depression without psychotic features (HCC); Eating disorder; Generalized anxiety disorder; Supervision of other normal pregnancy, antepartum; Anemia in pregnancy, third trimester; [redacted] weeks gestation of pregnancy; and IDA (iron deficiency anemia) on their problem list.  ----------------------------------------------------------------------------------- Patient reports no complaints.  Doing well.  No concerns today.  Contractions: Not present. Vag. Bleeding: None.  Movement: Present. Leaking Fluid denies.  ----------------------------------------------------------------------------------- The following portions of the patient's history were reviewed and updated as appropriate: allergies, current medications, past family history, past medical history, past social history, past surgical history and problem list. Problem list updated.  Objective  Blood pressure 122/60, weight 157 lb (71.2 kg), last menstrual period 12/20/2020, currently breastfeeding. Pregravid weight 116 lb (52.6 kg) Total Weight Gain 41 lb (18.6 kg) Urinalysis: Urine Protein Negative  Urine Glucose Negative  Fetal Status: Fetal Heart Rate (bpm): 140 Fundal Height: 37 cm Movement: Present  Presentation: Vertex  General:  Alert, oriented and cooperative. Patient is in no acute distress.  Skin: Skin is warm and dry. No rash noted.   Cardiovascular: Normal heart rate noted  Respiratory: Normal respiratory effort, no problems with respiration noted  Abdomen: Soft, gravid, appropriate for gestational age. Pain/Pressure: Absent     Pelvic:  Cervical exam deferred        Extremities: Normal range of motion.  Edema: None  Mental Status: Normal mood and affect. Normal behavior. Normal  judgment and thought content.   Assessment   26 y.o. G2P1001 at [redacted]w[redacted]d by  09/11/2021, by Ultrasound presenting for routine prenatal visit  Plan   pregnancy 2 Problems (from 01/29/21 to present)     Problem Noted Resolved   Anemia in pregnancy, third trimester 06/27/2021 by Mirna Mires, CNM No   Overview Addendum 08/11/2021  5:18 PM by Mirna Mires, CNM    Low H and h noted at her 28 wk labs. Note sent via My Chart for pateint to begin on daily iron tablets. Plan to recheck her CBC at 32 weeks and refer to hematology if it remains low. H and H at 34 weeks is 8.6/24. Attempted to contact pt by phone- no VM.referral to Hematology made      Supervision of other normal pregnancy, antepartum 01/29/2021 by Natale Milch, MD No   Overview Addendum 07/23/2021 10:08 AM by Ellwood Sayers, CNM     Nursing Staff Provider  Office Location  [facility] Dating  [redacted]w[redacted]d 02/12/21  Language  English Anatomy US  Normal anatomy-   Flu Vaccine  Declines Genetic Screen  NIPS:   TDaP vaccine   07/09/21 Hgb A1C or  GTT Early : Third trimester : 128  Covid Unvaccinated covid in 2021   LAB RESULTS   Rhogam  na Blood Type O/Positive/-- (11/04 1126)   Feeding Plan Formula  Antibody Negative (11/04 1126)  Contraception Depo Rubella 1.48 (11/04 1126)  Circumcision NA RPR Non Reactive (11/04 1126)   Pediatrician   HBsAg Negative (11/04 1126)   Support Person Kai Levins  HIV Non Reactive (11/04 1126)  Prenatal Classes multip  Varicella Immune     GBS  (For PCN allergy, check sensitivities)   BTL Consent     VBAC Consent  Pap  2022    Hgb Electro  CF      SMA                    Term labor symptoms and general obstetric precautions including but not limited to vaginal bleeding, contractions, leaking of fluid and fetal movement were reviewed in detail with the patient. Please refer to After Visit Summary for other counseling recommendations.   Return in about 1 week (around 09/10/2021)  for ROB.  Carie Caddy, CNM  Domingo Pulse, [facility]  09/03/21  12:10 PM

## 2021-09-06 ENCOUNTER — Inpatient Hospital Stay: Payer: Medicaid Other

## 2021-09-06 VITALS — BP 111/70 | HR 74 | Temp 97.4°F | Resp 16

## 2021-09-06 DIAGNOSIS — Z79899 Other long term (current) drug therapy: Secondary | ICD-10-CM | POA: Diagnosis not present

## 2021-09-06 DIAGNOSIS — O99013 Anemia complicating pregnancy, third trimester: Secondary | ICD-10-CM

## 2021-09-06 DIAGNOSIS — E611 Iron deficiency: Secondary | ICD-10-CM | POA: Diagnosis not present

## 2021-09-06 DIAGNOSIS — Z803 Family history of malignant neoplasm of breast: Secondary | ICD-10-CM | POA: Diagnosis not present

## 2021-09-06 DIAGNOSIS — Z818 Family history of other mental and behavioral disorders: Secondary | ICD-10-CM | POA: Diagnosis not present

## 2021-09-06 DIAGNOSIS — Z87891 Personal history of nicotine dependence: Secondary | ICD-10-CM | POA: Diagnosis not present

## 2021-09-06 DIAGNOSIS — R5383 Other fatigue: Secondary | ICD-10-CM | POA: Diagnosis not present

## 2021-09-06 DIAGNOSIS — D508 Other iron deficiency anemias: Secondary | ICD-10-CM

## 2021-09-06 DIAGNOSIS — Z3A00 Weeks of gestation of pregnancy not specified: Secondary | ICD-10-CM | POA: Diagnosis not present

## 2021-09-06 DIAGNOSIS — Z833 Family history of diabetes mellitus: Secondary | ICD-10-CM | POA: Diagnosis not present

## 2021-09-06 MED ORDER — IRON SUCROSE 20 MG/ML IV SOLN
200.0000 mg | Freq: Once | INTRAVENOUS | Status: AC
  Administered 2021-09-06: 200 mg via INTRAVENOUS
  Filled 2021-09-06: qty 10

## 2021-09-06 MED ORDER — SODIUM CHLORIDE 0.9 % IV SOLN
Freq: Once | INTRAVENOUS | Status: AC
  Filled 2021-09-06: qty 250

## 2021-09-06 MED ORDER — SODIUM CHLORIDE 0.9 % IV SOLN: 200.0000 mg | Freq: Once | INTRAVENOUS | Status: DC

## 2021-09-06 NOTE — Patient Instructions (Signed)

## 2021-09-08 ENCOUNTER — Inpatient Hospital Stay
Admission: EM | Admit: 2021-09-08 | Discharge: 2021-09-09 | DRG: 807 | Disposition: A | Discharge status: Home / Self Care | Payer: Medicaid Other | Attending: Obstetrics | Admitting: Obstetrics

## 2021-09-08 ENCOUNTER — Encounter: Payer: Self-pay | Admitting: Obstetrics and Gynecology

## 2021-09-08 ENCOUNTER — Other Ambulatory Visit: Payer: Self-pay

## 2021-09-08 DIAGNOSIS — O9902 Anemia complicating childbirth: Secondary | ICD-10-CM | POA: Diagnosis present

## 2021-09-08 DIAGNOSIS — Z3480 Encounter for supervision of other normal pregnancy, unspecified trimester: Principal | ICD-10-CM

## 2021-09-08 DIAGNOSIS — D649 Anemia, unspecified: Secondary | ICD-10-CM | POA: Diagnosis not present

## 2021-09-08 DIAGNOSIS — O99013 Anemia complicating pregnancy, third trimester: Secondary | ICD-10-CM

## 2021-09-08 DIAGNOSIS — O43123 Velamentous insertion of umbilical cord, third trimester: Secondary | ICD-10-CM | POA: Diagnosis not present

## 2021-09-08 DIAGNOSIS — Z3A39 39 weeks gestation of pregnancy: Secondary | ICD-10-CM | POA: Diagnosis not present

## 2021-09-08 DIAGNOSIS — D509 Iron deficiency anemia, unspecified: Secondary | ICD-10-CM | POA: Diagnosis not present

## 2021-09-08 DIAGNOSIS — O26893 Other specified pregnancy related conditions, third trimester: Secondary | ICD-10-CM | POA: Diagnosis not present

## 2021-09-08 LAB — TYPE AND SCREEN
ABO/RH(D): O POS
Antibody Screen: NEGATIVE

## 2021-09-08 LAB — CBC
HCT: 35.5 % — ABNORMAL LOW (ref 36.0–46.0)
Hemoglobin: 10.7 g/dL — ABNORMAL LOW (ref 12.0–15.0)
MCH: 23.5 pg — ABNORMAL LOW (ref 26.0–34.0)
MCHC: 30.1 g/dL (ref 30.0–36.0)
MCV: 77.9 fL — ABNORMAL LOW (ref 80.0–100.0)
Platelets: 351 10*3/uL (ref 150–400)
RBC: 4.56 MIL/uL (ref 3.87–5.11)
RDW: 25 % — ABNORMAL HIGH (ref 11.5–15.5)
WBC: 10.8 10*3/uL — ABNORMAL HIGH (ref 4.0–10.5)
nRBC: 0 % (ref 0.0–0.2)

## 2021-09-08 LAB — RPR: RPR Ser Ql: NONREACTIVE

## 2021-09-08 MED ORDER — DOCUSATE SODIUM 100 MG PO CAPS
100.0000 mg | ORAL_CAPSULE | Freq: Two times a day (BID) | ORAL | Status: DC
  Administered 2021-09-09: 100 mg via ORAL
  Filled 2021-09-08 (×2): qty 1

## 2021-09-08 MED ORDER — SIMETHICONE 80 MG PO CHEW: 80.0000 mg | CHEWABLE_TABLET | ORAL | Status: DC | PRN

## 2021-09-08 MED ORDER — LACTATED RINGERS IV SOLN
500.0000 mL | INTRAVENOUS | Status: DC | PRN
  Administered 2021-09-08: 1000 mL via INTRAVENOUS

## 2021-09-08 MED ORDER — METHYLERGONOVINE MALEATE 0.2 MG/ML IJ SOLN: 0.2000 mg | INTRAMUSCULAR | Status: DC | PRN

## 2021-09-08 MED ORDER — LACTATED RINGERS IV SOLN: INTRAVENOUS | Status: DC

## 2021-09-08 MED ORDER — DIPHENHYDRAMINE HCL 25 MG PO CAPS: 25.0000 mg | ORAL_CAPSULE | Freq: Four times a day (QID) | ORAL | Status: DC | PRN

## 2021-09-08 MED ORDER — HYDROXYZINE HCL 50 MG PO TABS: 50.0000 mg | ORAL_TABLET | Freq: Four times a day (QID) | ORAL | Status: DC | PRN

## 2021-09-08 MED ORDER — ACETAMINOPHEN 325 MG PO TABS: 650.0000 mg | ORAL_TABLET | ORAL | Status: DC | PRN

## 2021-09-08 MED ORDER — OXYTOCIN-SODIUM CHLORIDE 30-0.9 UT/500ML-% IV SOLN
2.5000 [IU]/h | INTRAVENOUS | Status: DC
  Filled 2021-09-08: qty 500

## 2021-09-08 MED ORDER — PRENATAL MULTIVITAMIN CH
1.0000 | ORAL_TABLET | Freq: Every day | ORAL | Status: DC
  Administered 2021-09-08 – 2021-09-09 (×2): 1 via ORAL
  Filled 2021-09-08 (×2): qty 1

## 2021-09-08 MED ORDER — FERROUS SULFATE 325 (65 FE) MG PO TABS
325.0000 mg | ORAL_TABLET | Freq: Two times a day (BID) | ORAL | Status: DC
  Administered 2021-09-08 – 2021-09-09 (×3): 325 mg via ORAL
  Filled 2021-09-08 (×3): qty 1

## 2021-09-08 MED ORDER — BENZOCAINE-MENTHOL 20-0.5 % EX AERO
1.0000 "application " | INHALATION_SPRAY | CUTANEOUS | Status: DC | PRN
  Filled 2021-09-08: qty 56

## 2021-09-08 MED ORDER — LIDOCAINE HCL (PF) 1 % IJ SOLN: 30.0000 mL | INTRAMUSCULAR | Status: DC | PRN

## 2021-09-08 MED ORDER — FENTANYL CITRATE (PF) 100 MCG/2ML IJ SOLN: 50.0000 ug | INTRAMUSCULAR | Status: DC | PRN

## 2021-09-08 MED ORDER — LIDOCAINE HCL (PF) 1 % IJ SOLN
INTRAMUSCULAR | Status: AC
  Filled 2021-09-08: qty 30

## 2021-09-08 MED ORDER — OXYCODONE-ACETAMINOPHEN 5-325 MG PO TABS: 1.0000 | ORAL_TABLET | ORAL | Status: DC | PRN

## 2021-09-08 MED ORDER — ONDANSETRON HCL 4 MG/2ML IJ SOLN: 4.0000 mg | Freq: Four times a day (QID) | INTRAMUSCULAR | Status: DC | PRN

## 2021-09-08 MED ORDER — ACETAMINOPHEN 325 MG PO TABS
650.0000 mg | ORAL_TABLET | ORAL | Status: DC | PRN
  Administered 2021-09-08: 650 mg via ORAL
  Filled 2021-09-08: qty 2

## 2021-09-08 MED ORDER — TETANUS-DIPHTH-ACELL PERTUSSIS 5-2.5-18.5 LF-MCG/0.5 IM SUSY
0.5000 mL | PREFILLED_SYRINGE | Freq: Once | INTRAMUSCULAR | Status: DC
  Filled 2021-09-08: qty 0.5

## 2021-09-08 MED ORDER — IBUPROFEN 600 MG PO TABS
600.0000 mg | ORAL_TABLET | Freq: Four times a day (QID) | ORAL | Status: DC
  Administered 2021-09-08 – 2021-09-09 (×6): 600 mg via ORAL
  Filled 2021-09-08 (×6): qty 1

## 2021-09-08 MED ORDER — OXYTOCIN BOLUS FROM INFUSION
333.0000 mL | Freq: Once | INTRAVENOUS | Status: AC
  Administered 2021-09-08: 333 mL via INTRAVENOUS

## 2021-09-08 MED ORDER — COCONUT OIL OIL: 1.0000 "application " | TOPICAL_OIL | Status: DC | PRN

## 2021-09-08 MED ORDER — OXYTOCIN-SODIUM CHLORIDE 30-0.9 UT/500ML-% IV SOLN: 2.5000 [IU]/h | INTRAVENOUS | Status: DC | PRN

## 2021-09-08 MED ORDER — METHYLERGONOVINE MALEATE 0.2 MG PO TABS: 0.2000 mg | ORAL_TABLET | ORAL | Status: DC | PRN

## 2021-09-08 NOTE — Progress Notes (Signed)
I was present following delivery to assist [name].   Amy [name] 09/08/2021 3:16 AM

## 2021-09-08 NOTE — Discharge Summary (Signed)
Postpartum Discharge Summary  Date of Service 09/09/21      Patient Name: Amy [name] DOB: December 28, 1995 MRN: [id number]  Date of admission: 09/08/2021 Delivery date:09/08/2021  Delivering provider: Lurlean Horns  Date of discharge: 09/09/21  Admitting diagnosis: Labor and delivery, indication for care [O75.9] Intrauterine pregnancy: [redacted]w[redacted]d    Secondary diagnosis:  Principal Problem:   Labor and delivery, indication for care  Additional problems: Anemia    Discharge diagnosis: Term Pregnancy Delivered and Anemia                                              Post partum procedures: None Augmentation: N/A Complications: None  Hospital course: Onset of Labor With Vaginal Delivery      26y.o. yo G2P1001 at 321w4das admitted in Active Labor on 09/08/2021. Patient had an uncomplicated labor course as follows:  Membrane Rupture Time/Date: 2:16 AM ,09/08/2021   Delivery Method:Vaginal, Spontaneous  Episiotomy: None  Lacerations:  None;1st degree;Labial  Patient had an uncomplicated postpartum course.  She is ambulating, tolerating a regular diet, passing flatus, and urinating well. Patient is discharged home in stable condition on 09/08/21. She declines an iron infusion prior to discharge and states she has one scheduled outpatient.  Newborn Data: Birth date:09/08/2021  Birth time:2:16 AM  Gender:Female  Living status:Living  Apgars:8 ,9  Weight: 3180 g (7 lbs. 0.2 oz)  Magnesium Sulfate received: No BMZ received: No Rhophylac:N/A MMR:N/A T-DaP:Given prenatally Flu: N/A Transfusion:No  Physical exam  Vitals:   09/08/21 0114  BP: 125/75  Pulse: 70  Resp: 16  Temp: 97.8 F (36.6 C)  TempSrc: Oral  Weight: 71.2 kg  Height: 5' 8" (1.727 m)   General: alert, cooperative, and no distress Lochia: appropriate Uterine Fundus: firm Laceration: Healing well with no significant drainage DVT Evaluation: No evidence of DVT seen on physical  exam. Labs: Lab Results  Component Value Date   WBC 10.8 (H) 09/08/2021   HGB 10.7 (L) 09/08/2021   HCT 35.5 (L) 09/08/2021   MCV 77.9 (L) 09/08/2021   PLT 351 09/08/2021      Latest Ref Rng & Units 12/18/2019   12:55 PM  CMP  Glucose 70 - 99 mg/dL 104   BUN 6 - 20 mg/dL 10   Creatinine 0.44 - 1.00 mg/dL 0.78   Sodium 135 - 145 mmol/L 138   Potassium 3.5 - 5.1 mmol/L 3.7   Chloride 98 - 111 mmol/L 103   CO2 22 - 32 mmol/L 26   Calcium 8.9 - 10.3 mg/dL 9.3   Total Protein 6.5 - 8.1 g/dL 7.5   Total Bilirubin 0.3 - 1.2 mg/dL 0.7   Alkaline Phos 38 - 126 U/L 66   AST 15 - 41 U/L 12   ALT 0 - 44 U/L 9    Edinburgh Score:    11/21/2017    9:48 AM  Edinburgh Postnatal Depression Scale Screening Tool  I have been able to laugh and see the funny side of things. 0  I have looked forward with enjoyment to things. 1  I have blamed myself unnecessarily when things went wrong. 2  I have been anxious or worried for no good reason. 2  I have felt scared or panicky for no good reason. 1  Things have been getting on top of me. 1  I  have been so unhappy that I have had difficulty sleeping. 1  I have felt sad or miserable. 1  I have been so unhappy that I have been crying. 1  The thought of harming myself has occurred to me. 0  Edinburgh Postnatal Depression Scale Total 10      After visit meds:     Discharge home in stable condition Infant Feeding: Bottle Infant Disposition:home with mother Discharge instruction: per After Visit Summary and Postpartum booklet. Activity: Advance as tolerated. Pelvic rest for 6 weeks.  Diet: routine diet Anticipated Birth Control: Depo Postpartum Appointment: 2 weeks and 6 weeks Additional Postpartum F/U:  Iron infusion Future Appointments: Future Appointments  Date Time Provider [place]  09/10/2021  8:55 AM April Manson, MD [facility] None  11/19/2021 10:15 AM [facility] None  11/22/2021  2:30 PM Earlie Server, MD [facility] None   11/22/2021  3:00 PM [facility] INFUSION CHAIR 4 [facility] None   Follow up Visit: 2 weeks and 6 weeks PP  Lloyd Huger, CNM 09/09/21 10:28 AM

## 2021-09-08 NOTE — H&P (Signed)
History and Physical   HPI  Amy [name] Amy [name] is a 26 y.o. G2P1001 at [redacted]w[redacted]d Estimated Date of Delivery: 09/11/21 who is being admitted for labor management. She reports contractions that became regular around 2200. She denies LOF and vaginal bleeding. She reports good fetal movement.   OB History  OB History  Gravida Para Term Preterm AB Living  2 1 1  0 0 1  SAB IAB Ectopic Multiple Live Births  0 0 0 0 1    # Outcome Date GA Lbr Len/2nd Weight Sex Delivery Anes PTL Lv  2 Current           1 Term 11/02/17 [redacted]w[redacted]d 01:53 / 01:57 3270 g M Vag-Spont EPI  LIV     Name: [name], Amy [name] Amy     Apgar1: 7  Apgar5: 9    PROBLEM LIST  Pregnancy complications or risks: Patient Active Problem List   Diagnosis Date Noted   Labor and delivery, indication for care 09/08/2021   IDA (iron deficiency anemia) 08/21/2021   [redacted] weeks gestation of pregnancy 08/16/2021   Anemia in pregnancy, third trimester 06/27/2021   Supervision of other normal pregnancy, antepartum 01/29/2021   Severe recurrent major depression without psychotic features (HCC) 08/21/2014   Eating disorder 08/21/2014   Generalized anxiety disorder 08/21/2014    Prenatal labs and studies: ABO, Rh: --/--/PENDING (06/14 0146) Antibody: PENDING (06/14 0146) Rubella: 1.48 (11/04 1126) RPR: Non Reactive (03/31 1020)  HBsAg: Negative (11/04 1126)  HIV: Non Reactive (03/31 1020)  08-30-1986-- (05/22 08-01-1995)   Past Medical History:  Diagnosis Date   Anxiety    Asthma    pt states she has not taken medications for this since age 52   Depression      Past Surgical History:  Procedure Laterality Date   WISDOM TOOTH EXTRACTION       Medications    Current Discharge Medication List     CONTINUE these medications which have NOT CHANGED   Details  Fe Fum-Vit C-Vit B12-FA 200-250-0.01-1 MG CAPS Take 1 capsule by mouth daily at 8 pm. Qty: 30 capsule, Refills: 2   Associated Diagnoses: Anemia during  pregnancy in third trimester    Prenatal Vit-Fe Fumarate-FA (MULTIVITAMIN-PRENATAL) 27-0.8 MG TABS tablet Take 1 tablet by mouth daily at 12 noon.         Allergies  Patient has no known allergies.  Review of Systems  Pertinent items are noted in HPI.  Physical Exam  BP 125/75 (BP Location: Left Arm)   Pulse 70   Temp 97.8 F (36.6 C) (Oral)   Resp 16   Ht 5\' 8"  (1.727 m)   Wt 71.2 kg   LMP 12/20/2020   BMI 23.87 kg/m   Lungs:  CTA B Cardio: RRR without M/R/G Abd: Soft, gravid, NT Presentation: cephalic  CERVIX: 5/80/-2  See Prenatal records for more detailed PE.     FHR:  Baseline: 130 Variability: moderate Accelerations: present, 15x15 Decelerations: none Toco: regular, every 2-3 minutes Category  Test Results  Results for orders placed or performed during the hospital encounter of 09/08/21 (from the past 24 hour(s))  CBC     Status: Abnormal   Collection Time: 09/08/21  1:46 AM  Result Value Ref Range   WBC 10.8 (H) 4.0 - 10.5 K/uL   RBC 4.56 3.87 - 5.11 MIL/uL   Hemoglobin 10.7 (L) 12.0 - 15.0 g/dL   HCT 09/10/21 (L) 09/10/21 - 70.0 %   MCV 77.9 (L) 80.0 -  100.0 fL   MCH 23.5 (L) 26.0 - 34.0 pg   MCHC 30.1 30.0 - 36.0 g/dL   RDW 19.1 (H) 47.8 - 29.5 %   Platelets 351 150 - 400 K/uL   nRBC 0.0 0.0 - 0.2 %  Type and screen [facility]     Status: None (Preliminary result)   Collection Time: 09/08/21  1:46 AM  Result Value Ref Range   ABO/RH(D) PENDING    Antibody Screen PENDING    Sample Expiration      09/11/2021,2359 Performed at [facility], [address], [place], [place] [address]    Group B Strep negative  Assessment   G2P1001 at [redacted]w[redacted]d Estimated Date of Delivery: 09/11/21  Reassuring maternal/fetal status. 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 Admit to L&D :   2. EFM: intermittent monitoring if desired-- Category 1 3. Pharmacologic pain relief if desired.   4. Admission labs  5. Anticipate NSVD 6. Dr. Okey Dupre notified of admission  [name], [facility] 09/08/2021 3:27 AM

## 2021-09-08 NOTE — OB Triage Note (Signed)
Pt Amy [name] 26 y.o. presents to labor and delivery triage reporting contractions every 4-5 minutes. She states that she has been drinking 4-5 bottles of water daily. States that she had sex within the last 38 hoursPt is a G2P1001 at [redacted]w[redacted]d . Pt denies signs and symptoms consistent with rupture of membranes or active vaginal bleeding. Pt states positive fetal movement. External FM and TOCO applied to non-tender abdomen and assessing. Initial FHR 125. Vital signs obtained and within normal limits. Provider notified of pt.

## 2021-09-09 LAB — CBC
HCT: 26.9 % — ABNORMAL LOW (ref 36.0–46.0)
Hemoglobin: 7.9 g/dL — ABNORMAL LOW (ref 12.0–15.0)
MCH: 23.6 pg — ABNORMAL LOW (ref 26.0–34.0)
MCHC: 29.4 g/dL — ABNORMAL LOW (ref 30.0–36.0)
MCV: 80.3 fL (ref 80.0–100.0)
Platelets: 261 10*3/uL (ref 150–400)
RBC: 3.35 MIL/uL — ABNORMAL LOW (ref 3.87–5.11)
RDW: 25.4 % — ABNORMAL HIGH (ref 11.5–15.5)
WBC: 9.4 10*3/uL (ref 4.0–10.5)
nRBC: 0 % (ref 0.0–0.2)

## 2021-09-09 MED ORDER — MEDROXYPROGESTERONE ACETATE 150 MG/ML IM SUSP
150.0000 mg | Freq: Once | INTRAMUSCULAR | Status: AC
  Administered 2021-09-09: 150 mg via INTRAMUSCULAR
  Filled 2021-09-09: qty 1

## 2021-09-09 NOTE — Discharge Instructions (Signed)

## 2021-09-09 NOTE — TOC Initial Note (Signed)
Transition of Care [facility]) - Initial/Assessment Note    Patient Details  Name: Amy [name] MRN: [id number] Date of Birth: 23-Feb-1996  Transition [facility]) CM/SW Contact:    [place] Cellar, RN Phone Number: 09/09/2021, 2:44 PM  Clinical Narrative:                 Spoke to patient regarding (+) MJ screening and mandated CPS report. Mother verbalized understanding and reports she is doing great and eager to discharge home. No needs or concerns related to transportation or support at home. Planning to bottlefeed and discussed in detail how to set up [facility] service. Has selected pediatrician and has follow up appointment. Discussed PPD and resources if needed. Patient reports no history of anxiety/depression. No other needs or concerns per nursing staff or TOC.   LVMM for CPS report related to MJ use.         Patient Goals and CMS Choice        Expected Discharge Plan and Services           Expected Discharge Date: 09/09/21                                    Prior Living Arrangements/Services                       Activities of Daily Living Home Assistive Devices/Equipment: None ADL Screening (condition at time of admission) Patient's cognitive ability adequate to safely complete daily activities?: Yes Is the patient deaf or have difficulty hearing?: No Does the patient have difficulty seeing, even when wearing glasses/contacts?: No Does the patient have difficulty concentrating, remembering, or making decisions?: No Patient able to express need for assistance with ADLs?: Yes Does the patient have difficulty dressing or bathing?: No Independently performs ADLs?: Yes (appropriate for developmental age) Does the patient have difficulty walking or climbing stairs?: No Weakness of Legs: None Weakness of Arms/Hands: None  Permission Sought/Granted                  Emotional Assessment              Admission diagnosis:  Labor and  delivery, indication for care [O75.9] Patient Active Problem List   Diagnosis Date Noted   Labor and delivery, indication for care 09/08/2021   IDA (iron deficiency anemia) 08/21/2021   [redacted] weeks gestation of pregnancy 08/16/2021   Anemia in pregnancy, third trimester 06/27/2021   Supervision of other normal pregnancy, antepartum 01/29/2021   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

## 2021-09-09 NOTE — Progress Notes (Signed)
Patient discharged home with family.  Discharge instructions, when to follow up, and prescriptions reviewed with patient.  Patient verbalized understanding. Patient will be escorted out by auxiliary.   

## 2021-09-09 NOTE — Final Progress Note (Signed)
Post Partum Day 1 Subjective: Amy [name] is feeling well. She is ambulating without difficulty and tolerating POs. Her pain is well-controlled and bleeding is minimal. She would like to go home today. Her hemoglobin is 7.9; she is currently asymptomatic and declines an iron infusion today. She has one scheduled later this month and will keep that appointment.    Objective: Blood pressure 100/62, pulse 64, temperature 98.1 F (36.7 C), resp. rate 20, height 5\' 8"  (1.727 m), weight 71.2 kg, last menstrual period 12/20/2020, SpO2 99 %, unknown if currently breastfeeding.  Physical Exam:  General: alert, cooperative, and appears stated age Lochia: appropriate Uterine Fundus: firm Laceration: healing well DVT Evaluation: No evidence of DVT seen on physical exam.  Recent Labs    09/08/21 0146 09/09/21 0525  HGB 10.7* 7.9*  HCT 35.5* 26.9*    Assessment/Plan: Discharge home and Social Work consult Discharge teaching completed and questions answered. Follow up in 2 weeks and 6 weeks with [facility].   LOS: 1 day   09/11/21 09/09/2021, 10:29 AM

## 2021-09-10 ENCOUNTER — Encounter: Payer: Medicaid Other | Admitting: Obstetrics

## 2021-09-22 ENCOUNTER — Ambulatory Visit (INDEPENDENT_AMBULATORY_CARE_PROVIDER_SITE_OTHER): Payer: Medicaid Other | Admitting: Obstetrics

## 2021-09-22 ENCOUNTER — Encounter: Payer: Self-pay | Admitting: Obstetrics

## 2021-09-22 NOTE — Progress Notes (Signed)
Postpartum Visit  Chief Complaint:  Chief Complaint  Patient presents with   Postpartum Care    History of Present Illness: Patient is a 26 y.o. [id number] presents for postpartum visit.  Date of delivery: 09/08/2021 Type of delivery: Vaginal delivery - Vacuum or forceps assisted  no Episiotomy No.  Laceration: yes- labila laceration and a 1st degre perineal  Pregnancy or labor problems:  no Any problems since the delivery:  no. She is smiling, reports only feeling a tad tired. Denies any depressive sxs  Newborn Details:  SINGLETON :  1. Baby's name: girl. Birth weight: 7lbs Maternal Details:  Breast Feeding:  no Post partum depression/anxiety noted:  no Edinburgh Post-Partum Depression Score:  1  Date of last PAP: 01/29/2021 normal   Past Medical History:  Diagnosis Date   Anxiety    Asthma    pt states she has not taken medications for this since age 21   Depression     Past Surgical History:  Procedure Laterality Date   WISDOM TOOTH EXTRACTION      Prior to Admission medications   Medication Sig Start Date End Date Taking? Authorizing Provider  Fe Fum-Vit C-Vit B12-FA 200-250-0.01-1 MG CAPS Take 1 capsule by mouth daily at 8 pm. 08/11/21  Yes Mirna Mires, CNM  Prenatal Vit-Fe Fumarate-FA (MULTIVITAMIN-PRENATAL) 27-0.8 MG TABS tablet Take 1 tablet by mouth daily at 12 noon.   Yes [provider]    No Known Allergies   Social History   Socioeconomic History   Marital status: Significant Other    Spouse name: Casimiro Needle (fiance)   Number of children: 1   Years of education: Not on file   Highest education level: 11th grade  Occupational History   Occupation: unemployed  Tobacco Use   Smoking status: Former    Packs/day: 0.25    Years: 1.00    Total pack years: 0.25    Types: E-cigarettes, Cigarettes    Start date: 03/26/2017    Quit date: 04/04/2017    Years since quitting: 4.4   Smokeless tobacco: Never  Vaping Use   Vaping Use: Former   Substance and Sexual Activity   Alcohol use: Not Currently   Drug use: Not Currently    Types: Marijuana    Comment: had +MJ screen at [facility]   Sexual activity: Yes    Partners: Female    Birth control/protection: Injection    Comment: depo  Other Topics Concern   Not on file  Social History Narrative   Lives with adoptive father and her boyfriend; baby boy is due 10/30/2017      Mom and older sister live in [place] and she does have periodic contact with them.   Social Determinants of Health   Financial Resource Strain: Low Risk  (10/24/2017)   Overall Financial Resource Strain (CARDIA)    Difficulty of Paying Living Expenses: Not hard at all  Food Insecurity: No Food Insecurity (10/24/2017)   Hunger Vital Sign    Worried About Running Out of Food in the Last Year: Never true    Ran Out of Food in the Last Year: Never true  Transportation Needs: No Transportation Needs (10/24/2017)   PRAPARE - Administrator, Civil Service (Medical): No    Lack of Transportation (Non-Medical): No  Physical Activity: Unknown (04/27/2018)   Exercise Vital Sign    Days of Exercise per Week: 1 day    Minutes of Exercise per Session: Not on file  Stress: No  Stress Concern Present (10/24/2017)   [employer] of Occupational Health - Occupational Stress Questionnaire    Feeling of Stress : Only a little  Social Connections: Moderately Isolated (10/24/2017)   Social Connection and Isolation Panel [NHANES]    Frequency of Communication with Friends and Family: More than three times a week    Frequency of Social Gatherings with Friends and Family: More than three times a week    Attends Religious Services: Never    Database administrator or Organizations: No    Attends Banker Meetings: Never    Marital Status: Never married  Intimate Partner Violence: Not At Risk (10/24/2017)   Humiliation, Afraid, Rape, and Kick questionnaire    Fear of Current or Ex-Partner: No    Emotionally  Abused: No    Physically Abused: No    Sexually Abused: No    Family History  Adopted: Yes  Problem Relation Age of Onset   Depression Mother    Depression Sister    Premature birth Sister    Depression Sister    Depression Brother    Anxiety disorder Brother    Breast cancer Maternal Grandmother    Diabetes Mellitus II Maternal Grandmother     ROS   Physical Exam BP 120/80   Ht 5\' 8"  (1.727 m)   Wt 132 lb (59.9 kg)   Breastfeeding No   BMI 20.07 kg/m   OBGyn Exam   Female Chaperone present during breast and/or pelvic exam.  Assessment: 26 y.o. 30 presenting for 2 week postpartum visit  Plan: Problem List Items Addressed This Visit   None Visit Diagnoses     Postpartum care following vaginal delivery    -  Primary        1) Contraception Education given regarding options for contraception, including injectable contraception. She received Depor prior to hospital discharge. 2)  Pap - ASCCP guidelines and rational discussed.  Patient opts for annual screening interval  3) Patient underwent screening for postpartum depression with no concerns noted.  4) Follow up 1 month  for her 6 week PP physical.  [id number], CNM  09/22/2021 2:21 PM   09/22/2021 2:14 PM

## 2021-09-25 DIAGNOSIS — Z419 Encounter for procedure for purposes other than remedying health state, unspecified: Secondary | ICD-10-CM | POA: Diagnosis not present

## 2021-10-20 ENCOUNTER — Encounter: Payer: Self-pay | Admitting: Advanced Practice Midwife

## 2021-10-20 ENCOUNTER — Ambulatory Visit (INDEPENDENT_AMBULATORY_CARE_PROVIDER_SITE_OTHER): Payer: Medicaid Other | Admitting: Advanced Practice Midwife

## 2021-10-20 DIAGNOSIS — Z3042 Encounter for surveillance of injectable contraceptive: Secondary | ICD-10-CM

## 2021-10-20 MED ORDER — MEDROXYPROGESTERONE ACETATE 150 MG/ML IM SUSP: 150.0000 mg | INTRAMUSCULAR | Status: DC

## 2021-10-21 ENCOUNTER — Encounter: Payer: Self-pay | Admitting: Advanced Practice Midwife

## 2021-10-21 NOTE — Progress Notes (Signed)
Postpartum Visit  Date of Service: 10/20/2021  Chief Complaint:  Chief Complaint  Patient presents with   Postpartum Care    History of Present Illness: Patient is a 26 y.o. [id number] presents for postpartum visit.  Review the Delivery Report for details.   Date of delivery: 09/08/2021 Type of delivery: Vaginal delivery - Vacuum or forceps assisted  no Episiotomy No.  Laceration: 1st degree and labial repaired  Pregnancy or labor problems:  no Any problems since the delivery:  no  Newborn Details:  SINGLETON :  1. BabyGender female. Birth weight: 7 pounds Maternal Details:  Breast or formula feeding: formula Intercourse: No  Contraception after delivery:  Depo Any bowel or bladder issues: No  Post partum depression/anxiety noted:  mild, she is relying on her support system and trying to take naps when able [place] Post-Partum Depression Score: 6 Date of last PAP: 2022  no abnormalities   Review of Systems: Review of Systems  Constitutional:  Negative for chills and fever.  HENT:  Negative for congestion, ear discharge, ear pain, hearing loss, sinus pain and sore throat.   Eyes:  Negative for blurred vision and double vision.  Respiratory:  Negative for cough, shortness of breath and wheezing.   Cardiovascular:  Negative for chest pain, palpitations and leg swelling.  Gastrointestinal:  Negative for abdominal pain, blood in stool, constipation, diarrhea, heartburn, melena, nausea and vomiting.  Genitourinary:  Negative for dysuria, flank pain, frequency, hematuria and urgency.  Musculoskeletal:  Negative for back pain, joint pain and myalgias.  Skin:  Negative for itching and rash.  Neurological:  Negative for dizziness, tingling, tremors, sensory change, speech change, focal weakness, seizures, loss of consciousness, weakness and headaches.  Endo/Heme/Allergies:  Negative for environmental allergies. Does not bruise/bleed easily.  Psychiatric/Behavioral:  Negative  for depression, hallucinations, memory loss, substance abuse and suicidal ideas. The patient is not nervous/anxious and does not have insomnia.      Past Medical History:  Past Medical History:  Diagnosis Date   Anxiety    Asthma    pt states she has not taken medications for this since age 67   Depression    Labor and delivery, indication for care 09/08/2021    Past Surgical History:  Past Surgical History:  Procedure Laterality Date   WISDOM TOOTH EXTRACTION      Family History:  Family History  Adopted: Yes  Problem Relation Age of Onset   Depression Mother    Depression Sister    Premature birth Sister    Depression Sister    Depression Brother    Anxiety disorder Brother    Breast cancer Maternal Grandmother    Diabetes Mellitus II Maternal Grandmother     Social History:  Social History   Socioeconomic History   Marital status: Significant Other    Spouse name: Casimiro Needle (fiance)   Number of children: 1   Years of education: Not on file   Highest education level: 11th grade  Occupational History   Occupation: unemployed  Tobacco Use   Smoking status: Former    Packs/day: 0.25    Years: 1.00    Total pack years: 0.25    Types: E-cigarettes, Cigarettes    Start date: 03/26/2017    Quit date: 04/04/2017    Years since quitting: 4.5   Smokeless tobacco: Never  Vaping Use   Vaping Use: Former  Substance and Sexual Activity   Alcohol use: Not Currently   Drug use: Not Currently  Types: Marijuana    Comment: had +[facility]   Sexual activity: Yes    Partners: Female    Birth control/protection: Injection    Comment: depo  Other Topics Concern   Not on file  Social History Narrative   Lives with adoptive father and her boyfriend; baby boy is due 10/30/2017      Mom and older sister live in [place] and she does have periodic contact with them.   Social Determinants of Health   Financial Resource Strain: Low Risk  (10/24/2017)   Overall Financial  Resource Strain (CARDIA)    Difficulty of Paying Living Expenses: Not hard at all  Food Insecurity: No Food Insecurity (10/24/2017)   Hunger Vital Sign    Worried About Running Out of Food in the Last Year: Never true    Ran Out of Food in the Last Year: Never true  Transportation Needs: No Transportation Needs (10/24/2017)   PRAPARE - Hydrologist (Medical): No    Lack of Transportation (Non-Medical): No  Physical Activity: Unknown (04/27/2018)   Exercise Vital Sign    Days of Exercise per Week: 1 day    Minutes of Exercise per Session: Not on file  Stress: No Stress Concern Present (10/24/2017)   [place]    Feeling of Stress : Only a little  Social Connections: Moderately Isolated (10/24/2017)   Social Connection and Isolation Panel [NHANES]    Frequency of Communication with Friends and Family: More than three times a week    Frequency of Social Gatherings with Friends and Family: More than three times a week    Attends Religious Services: Never    Marine scientist or Organizations: No    Attends Archivist Meetings: Never    Marital Status: Never married  Intimate Partner Violence: Not At Risk (10/24/2017)   Humiliation, Afraid, Rape, and Kick questionnaire    Fear of Current or Ex-Partner: No    Emotionally Abused: No    Physically Abused: No    Sexually Abused: No    Allergies:  No Known Allergies  Medications: Prior to Admission medications   Medication Sig Start Date End Date Taking? Authorizing Provider  Fe Fum-Vit C-Vit B12-FA 200-250-0.01-1 MG CAPS Take 1 capsule by mouth daily at 8 pm. 08/11/21   Imagene Riches, CNM  Prenatal Vit-Fe Fumarate-FA (MULTIVITAMIN-PRENATAL) 27-0.8 MG TABS tablet Take 1 tablet by mouth daily at 12 noon.    [provider]    Physical Exam Blood pressure 122/70, height 5\' 8"  (1.727 m), weight 130 lb (59 kg), not  currently breastfeeding.    General: NAD HEENT: normocephalic, anicteric Pulmonary: No increased work of breathing Abdomen: NABS, soft, non-tender, non-distended.  Umbilicus without lesions.  No hepatomegaly, splenomegaly or masses palpable. No evidence of hernia. Genitourinary:  External: Normal external female genitalia.  Normal urethral meatus, normal Bartholin's and Skene's glands.    Vagina: Normal vaginal mucosa, no evidence of prolapse.    Cervix: Grossly normal in appearance, no bleeding  Uterus: Non-enlarged, mobile, normal contour.  No CMT  Adnexa: ovaries non-enlarged, no adnexal masses  Rectal: deferred Extremities: no edema, erythema, or tenderness Neurologic: Grossly intact Psychiatric: mood appropriate, affect full   Edinburgh Postnatal Depression Scale - 10/20/21 1105       Edinburgh Postnatal Depression Scale:  In the Past 7 Days   I have been able to laugh and see the funny  side of things. 0    I have looked forward with enjoyment to things. 0    I have blamed myself unnecessarily when things went wrong. 2    I have been anxious or worried for no good reason. 0    I have felt scared or panicky for no good reason. 0    Things have been getting on top of me. 2    I have been so unhappy that I have had difficulty sleeping. 1    I have felt sad or miserable. 1    The thought of harming myself has occurred to me. 0             Assessment: 26 y.o. [id number] presenting for 6 week postpartum visit  Plan: Problem List Items Addressed This Visit   None Visit Diagnoses     6 weeks postpartum follow-up    -  Primary   Relevant Medications   medroxyPROGESTERone (DEPO-PROVERA) injection 150 mg (Start on 12/10/2021 12:00 AM)   Encounter for surveillance of injectable contraceptive       Relevant Medications   medroxyPROGESTERone (DEPO-PROVERA) injection 150 mg (Start on 12/10/2021 12:00 AM)        1) Contraception - Education given regarding options for  contraception, as well as compatibility with breast feeding if applicable.  Patient plans on Depo-Provera injections for contraception.  2)  Pap - ASCCP guidelines and rationale discussed.  Patient opts for every 3 years screening interval  3) Patient underwent screening for postpartum depression with no signs of depression  4) Return in about 1 year (around 10/21/2022) for every 3 month Depo RN visit starting mid September , annual established gyn.   Tresea Mall, CNM [facility] 10/21/2021, 4:36 PM

## 2021-10-22 ENCOUNTER — Encounter: Payer: Self-pay | Admitting: Obstetrics

## 2021-10-22 ENCOUNTER — Encounter: Payer: Self-pay | Admitting: Oncology

## 2021-10-26 DIAGNOSIS — Z419 Encounter for procedure for purposes other than remedying health state, unspecified: Secondary | ICD-10-CM | POA: Diagnosis not present

## 2021-11-08 ENCOUNTER — Encounter: Payer: Self-pay | Admitting: Oncology

## 2021-11-16 ENCOUNTER — Encounter: Payer: Self-pay | Admitting: Oncology

## 2021-11-17 ENCOUNTER — Telehealth: Payer: Self-pay

## 2021-11-17 NOTE — Telephone Encounter (Signed)
Vm not set up, sent mychart message to confirm 11/23/21 appointment-Toni

## 2021-11-19 ENCOUNTER — Inpatient Hospital Stay: Payer: Medicaid Other | Attending: Oncology

## 2021-11-19 MED FILL — Iron Sucrose Inj 20 MG/ML (Fe Equiv): INTRAVENOUS | Qty: 10 | Status: AC

## 2021-11-22 ENCOUNTER — Inpatient Hospital Stay: Payer: Medicaid Other | Admitting: Oncology

## 2021-11-22 ENCOUNTER — Inpatient Hospital Stay: Payer: Medicaid Other

## 2021-11-23 ENCOUNTER — Telehealth: Payer: Self-pay

## 2021-11-23 ENCOUNTER — Ambulatory Visit: Payer: Medicaid Other | Admitting: Nurse Practitioner

## 2021-11-23 VITALS — BP 98/62 | HR 67 | Temp 98.4°F | Resp 16 | Ht 68.0 in | Wt 129.0 lb

## 2021-11-23 DIAGNOSIS — F411 Generalized anxiety disorder: Secondary | ICD-10-CM

## 2021-11-23 DIAGNOSIS — F53 Postpartum depression: Secondary | ICD-10-CM | POA: Diagnosis not present

## 2021-11-23 DIAGNOSIS — D508 Other iron deficiency anemias: Secondary | ICD-10-CM | POA: Diagnosis not present

## 2021-11-23 DIAGNOSIS — Z7689 Persons encountering health services in other specified circumstances: Secondary | ICD-10-CM

## 2021-11-23 NOTE — Progress Notes (Signed)
[facility] [address] [place], [place] [address]  Internal MEDICINE  Office Visit Note  Patient Name: Amy [name]  [id number]  [id number]  Date of Service: 11/23/2021   Complaints/HPI Pt is here for establishment of PCP. Chief Complaint  Patient presents with   New Patient (Initial Visit)    Postpartum   HPI [name] presents for a new patient visit to establish care.  Well appearing 26 yo female with anxiety, depression and asthma. She had milkd postpartum depression with first child. She had her second child in June this year and is having symptoms of PPD again.  Also has iron deficiency anemia, received several iron infusions prior to childbirth, no labs since june Accompanied to her visit by a close friend today, Rodney Booze Long whom she has given permission to be present at her office visit today.  Interested in therapist  Significant FH:  DM, breast cancer, alcoholism, dep and anx.  Work: [employer], out for 6 weeks for now.  Home: live at home with fiance and his family, 67 yo son lives with her father.  Diet: improving, thinks may have eating disorder? Binge eating.  Exercise: not very right now --recovering from childbirth Substances: smoker, vaping only,  no cig. No alcohol use marijuana use, 2-3 x per week.  Cocaine use 1 year ago.  Upcoming visit with OBGYN had recent labs  Prior therapist: [employer] --would like to see if she can go to her previous therapist if possible or will need new therapist -- acknowledges that she is struggling with sx of PPD and is seeking help   Edinburgh Postnatal Depression Scale - 11/23/21 1046       Edinburgh Postnatal Depression Scale:  In the Past 7 Days   I have been able to laugh and see the funny side of things. 0    I have looked forward with enjoyment to things. 1    I have blamed myself unnecessarily when things went wrong. 2    I have been anxious or worried for no good reason. 3    I have  felt scared or panicky for no good reason. 1    Things have been getting on top of me. 2    I have been so unhappy that I have had difficulty sleeping. 0    I have felt sad or miserable. 2    I have been so unhappy that I have been crying. 2    The thought of harming myself has occurred to me. 1    Edinburgh Postnatal Depression Scale Total 14              Current Medication: Outpatient Encounter Medications as of 11/23/2021  Medication Sig   Fe Fum-Vit C-Vit B12-FA 200-250-0.01-1 MG CAPS Take 1 capsule by mouth daily at 8 pm.   Prenatal Vit-Fe Fumarate-FA (MULTIVITAMIN-PRENATAL) 27-0.8 MG TABS tablet Take 1 tablet by mouth daily at 12 noon.   Facility-Administered Encounter Medications as of 11/23/2021  Medication   medroxyPROGESTERone (DEPO-PROVERA) injection 150 mg    Surgical History: Past Surgical History:  Procedure Laterality Date   WISDOM TOOTH EXTRACTION      Medical History: Past Medical History:  Diagnosis Date   Anxiety    Asthma    pt states she has not taken medications for this since age 44   Depression    Labor and delivery, indication for care 09/08/2021    Family History: Family History  Adopted: Yes  Problem  Relation Age of Onset   Depression Mother    Depression Sister    Premature birth Sister    Depression Sister    Depression Brother    Anxiety disorder Brother    Breast cancer Maternal Grandmother    Diabetes Mellitus II Maternal Grandmother     Social History   Socioeconomic History   Marital status: Significant Other    Spouse name: Casimiro Needle (fiance)   Number of children: 1   Years of education: Not on file   Highest education level: 11th grade  Occupational History   Occupation: unemployed  Tobacco Use   Smoking status: Former    Packs/day: 0.25    Years: 1.00    Total pack years: 0.25    Types: E-cigarettes, Cigarettes    Start date: 03/26/2017    Quit date: 04/04/2017    Years since quitting: 4.7   Smokeless tobacco:  Never  Vaping Use   Vaping Use: Former  Substance and Sexual Activity   Alcohol use: Not Currently   Drug use: Not Currently    Types: Marijuana    Comment: had +MJ screen at [facility]   Sexual activity: Yes    Partners: Female    Birth control/protection: Injection    Comment: depo  Other Topics Concern   Not on file  Social History Narrative   Lives with adoptive father and her boyfriend; baby boy is due 10/30/2017      Mom and older sister live in [place] and she does have periodic contact with them.   Social Determinants of Health   Financial Resource Strain: Low Risk  (10/24/2017)   Overall Financial Resource Strain (CARDIA)    Difficulty of Paying Living Expenses: Not hard at all  Food Insecurity: No Food Insecurity (10/24/2017)   Hunger Vital Sign    Worried About Running Out of Food in the Last Year: Never true    Ran Out of Food in the Last Year: Never true  Transportation Needs: No Transportation Needs (10/24/2017)   PRAPARE - Administrator, Civil Service (Medical): No    Lack of Transportation (Non-Medical): No  Physical Activity: Unknown (04/27/2018)   Exercise Vital Sign    Days of Exercise per Week: 1 day    Minutes of Exercise per Session: Not on file  Stress: No Stress Concern Present (10/24/2017)   [employer] of Occupational Health - Occupational Stress Questionnaire    Feeling of Stress : Only a little  Social Connections: Moderately Isolated (10/24/2017)   Social Connection and Isolation Panel [NHANES]    Frequency of Communication with Friends and Family: More than three times a week    Frequency of Social Gatherings with Friends and Family: More than three times a week    Attends Religious Services: Never    Database administrator or Organizations: No    Attends Banker Meetings: Never    Marital Status: Never married  Intimate Partner Violence: Not At Risk (10/24/2017)   Humiliation, Afraid, Rape, and Kick questionnaire    Fear of  Current or Ex-Partner: No    Emotionally Abused: No    Physically Abused: No    Sexually Abused: No     Review of Systems  Constitutional:  Positive for fatigue. Negative for chills and unexpected weight change.  HENT:  Positive for postnasal drip. Negative for congestion, ear pain, rhinorrhea, sneezing and sore throat.   Respiratory:  Negative for cough, chest tightness, shortness of breath and wheezing.  Asthma sx are stable, no issues  Cardiovascular: Negative.  Negative for chest pain and palpitations.  Gastrointestinal: Negative.  Negative for abdominal pain, constipation, diarrhea, nausea and vomiting.  Genitourinary:  Negative for dysuria and frequency.  Musculoskeletal: Negative.  Negative for arthralgias, back pain, joint swelling and neck pain.  Skin: Negative.   Neurological: Negative.  Negative for tremors and numbness.  Psychiatric/Behavioral:  Positive for behavioral problems (Depression), decreased concentration and sleep disturbance. Negative for self-injury and suicidal ideas. The patient is nervous/anxious.     Vital Signs: BP 98/62   Pulse 67   Temp 98.4 F (36.9 C)   Resp 16   Ht 5\' 8"  (1.727 m)   Wt 129 lb (58.5 kg)   SpO2 100%   BMI 19.61 kg/m    Physical Exam Vitals reviewed.  Constitutional:      General: She is not in acute distress.    Appearance: Normal appearance. She is normal weight. She is not ill-appearing.  HENT:     Head: Normocephalic and atraumatic.  Eyes:     Pupils: Pupils are equal, round, and reactive to light.  Cardiovascular:     Rate and Rhythm: Normal rate and regular rhythm.  Pulmonary:     Effort: Pulmonary effort is normal. No respiratory distress.  Neurological:     Mental Status: She is alert and oriented to person, place, and time.  Psychiatric:        Attention and Perception: She is inattentive.        Mood and Affect: Mood is anxious and depressed. Affect is tearful.        Speech: Speech is delayed.         Behavior: Behavior is slowed and withdrawn. Behavior is cooperative.        Thought Content: Thought content is not paranoid or delusional. Thought content does not include homicidal or suicidal ideation.        Cognition and Memory: Cognition and memory normal.        Judgment: Judgment normal.       Assessment/Plan: 1. Other iron deficiency anemia No repeat labs since June. Repeat CBC and iron panel, also B12 and folate. Was seeing Dr. July, hematology - CBC with Differential/Platelet - B12 and Folate Panel - Iron, TIBC and Ferritin Panel  2. Postpartum depression Edinburgh scale score of 14. Refer to therapist. Follow up in 1 month.  - Ambulatory referral to Psychology  3. Generalized anxiety disorder Anxiety is high alongside depressive symptoms -- referred patient for therapy with psychologist  4. Encounter to establish care with new doctor New patient, establish care, anemia, and psychiatric issues, recently had her second child. Need continuity of care, psych referral and continue preventive care.      General Counseling: Cathie Hoops verbalizes understanding of the findings of todays visit and agrees with plan of treatment. I have discussed any further diagnostic evaluation that may be needed or ordered today. We also reviewed her medications today. she has been encouraged to call the office with any questions or concerns that should arise related to todays visit.    Counseling:  [place] Controlled Substance Database was reviewed by me.  Orders Placed This Encounter  Procedures   CBC with Differential/Platelet   B12 and Folate Panel   Iron, TIBC and Ferritin Panel   Ambulatory referral to Psychology    No orders of the defined types were placed in this encounter.   Return in about 1 month (around 12/24/2021) for F/U, Anxiety/depression,  [place] Michie PCP.  Time spent:30 Minutes Time spent with patient included reviewing progress notes, labs, imaging studies, and discussing  plan for follow up.   [place] Controlled Substance Database was reviewed by me for overdose risk score (ORS)   This patient was seen by Sallyanne Kuster, FNP-C in collaboration with Dr. Beverely Risen as a part of collaborative care agreement.    Guilherme Schwenke R. Tedd Sias, MSN, FNP-C Internal Medicine

## 2021-11-23 NOTE — Telephone Encounter (Signed)
Awaiting 11/23/21 office notes for [facility] referral-Toni

## 2021-11-26 DIAGNOSIS — Z419 Encounter for procedure for purposes other than remedying health state, unspecified: Secondary | ICD-10-CM | POA: Diagnosis not present

## 2021-12-01 ENCOUNTER — Ambulatory Visit (INDEPENDENT_AMBULATORY_CARE_PROVIDER_SITE_OTHER): Payer: Medicaid Other

## 2021-12-01 VITALS — BP 98/58 | Ht 68.0 in | Wt 129.0 lb

## 2021-12-01 DIAGNOSIS — Z3042 Encounter for surveillance of injectable contraceptive: Secondary | ICD-10-CM

## 2021-12-01 MED ORDER — MEDROXYPROGESTERONE ACETATE 150 MG/ML IM SUSP
150.0000 mg | Freq: Once | INTRAMUSCULAR | Status: AC
  Administered 2021-12-01: 150 mg via INTRAMUSCULAR

## 2021-12-01 NOTE — Progress Notes (Signed)
Date last pap: 01/29/21. Last Depo-Provera: 09/09/21. Side Effects if any: None. Serum HCG indicated? N/A. Depo-Provera 150 mg IM given by: Rocco Serene, LPN. Site: Left Deltoid. Next appointment due 02/15/22 - 03/01/22.

## 2021-12-17 ENCOUNTER — Telehealth: Payer: Self-pay | Admitting: Nurse Practitioner

## 2021-12-17 NOTE — Telephone Encounter (Signed)
[place] referral faxed to [name]

## 2021-12-20 ENCOUNTER — Encounter: Payer: Self-pay | Admitting: Nurse Practitioner

## 2021-12-20 ENCOUNTER — Ambulatory Visit: Payer: Medicaid Other | Admitting: Internal Medicine

## 2021-12-26 DIAGNOSIS — Z419 Encounter for procedure for purposes other than remedying health state, unspecified: Secondary | ICD-10-CM | POA: Diagnosis not present

## 2022-01-10 ENCOUNTER — Ambulatory Visit: Payer: Medicaid Other | Admitting: Internal Medicine

## 2022-01-26 DIAGNOSIS — Z419 Encounter for procedure for purposes other than remedying health state, unspecified: Secondary | ICD-10-CM | POA: Diagnosis not present

## 2022-02-07 ENCOUNTER — Ambulatory Visit: Payer: Medicaid Other | Admitting: Internal Medicine

## 2022-02-23 ENCOUNTER — Ambulatory Visit (INDEPENDENT_AMBULATORY_CARE_PROVIDER_SITE_OTHER): Payer: Medicaid Other

## 2022-02-23 ENCOUNTER — Ambulatory Visit: Payer: Medicaid Other

## 2022-02-23 VITALS — Ht 68.0 in | Wt 125.0 lb

## 2022-02-23 DIAGNOSIS — Z3042 Encounter for surveillance of injectable contraceptive: Secondary | ICD-10-CM

## 2022-02-23 NOTE — Progress Notes (Signed)
    NURSE VISIT NOTE  Subjective:    Patient ID: Amy [name], female    DOB: 11/22/1995, 26 y.o.   MRN: [id number]  HPI  Patient is a 26 y.o. [id number] female who presents for depo provera injection. She reports she has been bleeding since she delivered 09/11/21. She has had 1 week without bleeding. She states she is not having sex at this time and would like to go off depo.   Objective:    Ht 5\' 8"  (1.727 m)   Wt 125 lb (56.7 kg)   BMI 19.01 kg/m     Assessment:   1. Encounter for Depo-Provera contraception      Plan:   Discussed with , CNM. Patient advised to contact Tresea Mall when she desires to restart birth control.  [place], LPN

## 2022-02-25 DIAGNOSIS — Z419 Encounter for procedure for purposes other than remedying health state, unspecified: Secondary | ICD-10-CM | POA: Diagnosis not present

## 2022-03-01 ENCOUNTER — Encounter: Payer: Self-pay | Admitting: Internal Medicine

## 2022-03-01 ENCOUNTER — Encounter: Payer: Self-pay | Admitting: Oncology

## 2022-03-01 ENCOUNTER — Ambulatory Visit: Payer: Medicaid Other | Admitting: Internal Medicine

## 2022-03-01 VITALS — BP 108/70 | HR 85 | Temp 98.4°F | Resp 16 | Ht 68.0 in | Wt 123.2 lb

## 2022-03-01 DIAGNOSIS — Z3009 Encounter for other general counseling and advice on contraception: Secondary | ICD-10-CM | POA: Diagnosis not present

## 2022-03-01 DIAGNOSIS — D508 Other iron deficiency anemias: Secondary | ICD-10-CM

## 2022-03-01 NOTE — Progress Notes (Signed)
[facility] [address] [place], [place] [address]  Internal MEDICINE  Office Visit Note  Patient Name: Amy [name]  [id number]  [id number]  Date of Service: 03/08/2022  Chief Complaint  Patient presents with   Follow-up   Anxiety   Depression    HPI Patient is here for routine follow-up She she has been anemic in the past was supposed to have a follow-up CBC and iron studies however did not do so Patient also had anxiety which is under control at the moment She does not wish to continue on medroxyprogesterone and will talk to OB/GYN about changing the medication    Current Medication: Outpatient Encounter Medications as of 03/01/2022  Medication Sig   Fe Fum-Vit C-Vit B12-FA 200-250-0.01-1 MG CAPS Take 1 capsule by mouth daily at 8 pm.   Prenatal Vit-Fe Fumarate-FA (MULTIVITAMIN-PRENATAL) 27-0.8 MG TABS tablet Take 1 tablet by mouth daily at 12 noon.   Facility-Administered Encounter Medications as of 03/01/2022  Medication   medroxyPROGESTERone (DEPO-PROVERA) injection 150 mg    Surgical History: Past Surgical History:  Procedure Laterality Date   WISDOM TOOTH EXTRACTION      Medical History: Past Medical History:  Diagnosis Date   Anxiety    Asthma    pt states she has not taken medications for this since age 86   Depression    Labor and delivery, indication for care 09/08/2021    Family History: Family History  Adopted: Yes  Problem Relation Age of Onset   Depression Mother    Depression Sister    Premature birth Sister    Depression Sister    Depression Brother    Anxiety disorder Brother    Breast cancer Maternal Grandmother    Diabetes Mellitus II Maternal Grandmother     Social History   Socioeconomic History   Marital status: Significant Other    Spouse name: Casimiro Needle (fiance)   Number of children: 1   Years of education: Not on file   Highest education level: 11th grade  Occupational History   Occupation:  unemployed  Tobacco Use   Smoking status: Former    Packs/day: 0.25    Years: 1.00    Total pack years: 0.25    Types: E-cigarettes, Cigarettes    Start date: 03/26/2017    Quit date: 04/04/2017    Years since quitting: 4.9   Smokeless tobacco: Never  Vaping Use   Vaping Use: Every day  Substance and Sexual Activity   Alcohol use: Not Currently   Drug use: Not Currently    Types: Marijuana    Comment: had +MJ screen at [facility]   Sexual activity: Yes    Partners: Female    Birth control/protection: Injection    Comment: depo  Other Topics Concern   Not on file  Social History Narrative   Lives with adoptive father and her boyfriend; baby boy is due 10/30/2017      Mom and older sister live in [place] and she does have periodic contact with them.   Social Determinants of Health   Financial Resource Strain: Low Risk  (10/24/2017)   Overall Financial Resource Strain (CARDIA)    Difficulty of Paying Living Expenses: Not hard at all  Food Insecurity: No Food Insecurity (10/24/2017)   Hunger Vital Sign    Worried About Running Out of Food in the Last Year: Never true    Ran Out of Food in the Last Year: Never true  Transportation Needs: No Transportation Needs (  10/24/2017)   PRAPARE - Administrator, Civil Service (Medical): No    Lack of Transportation (Non-Medical): No  Physical Activity: Unknown (04/27/2018)   Exercise Vital Sign    Days of Exercise per Week: 1 day    Minutes of Exercise per Session: Not on file  Stress: No Stress Concern Present (10/24/2017)   [employer] of Occupational Health - Occupational Stress Questionnaire    Feeling of Stress : Only a little  Social Connections: Moderately Isolated (10/24/2017)   Social Connection and Isolation Panel [NHANES]    Frequency of Communication with Friends and Family: More than three times a week    Frequency of Social Gatherings with Friends and Family: More than three times a week    Attends Religious Services:  Never    Database administrator or Organizations: No    Attends Banker Meetings: Never    Marital Status: Never married  Intimate Partner Violence: Not At Risk (10/24/2017)   Humiliation, Afraid, Rape, and Kick questionnaire    Fear of Current or Ex-Partner: No    Emotionally Abused: No    Physically Abused: No    Sexually Abused: No      Review of Systems  Constitutional:  Negative for fatigue and fever.  HENT:  Negative for congestion, mouth sores and postnasal drip.   Respiratory:  Negative for cough.   Cardiovascular:  Negative for chest pain.  Genitourinary:  Negative for flank pain.  Psychiatric/Behavioral: Negative.      Vital Signs: BP 108/70   Pulse 85   Temp 98.4 F (36.9 C)   Resp 16   Ht 5\' 8"  (1.727 m)   Wt 123 lb 3.2 oz (55.9 kg)   SpO2 98%   BMI 18.73 kg/m    Physical Exam Constitutional:      Appearance: Normal appearance.  HENT:     Head: Normocephalic and atraumatic.     Nose: Nose normal.     Mouth/Throat:     Mouth: Mucous membranes are moist.     Pharynx: No posterior oropharyngeal erythema.  Eyes:     Extraocular Movements: Extraocular movements intact.     Pupils: Pupils are equal, round, and reactive to light.  Cardiovascular:     Pulses: Normal pulses.     Heart sounds: Normal heart sounds.  Pulmonary:     Effort: Pulmonary effort is normal.     Breath sounds: Normal breath sounds.  Neurological:     General: No focal deficit present.     Mental Status: She is alert.  Psychiatric:        Mood and Affect: Mood normal.        Behavior: Behavior normal.        Assessment/Plan: 1. Other iron deficiency anemia Patient is instructed to continue take her iron and repeat her labs when she gets a chance - CBC with Differential/Platelet - Iron, TIBC and Ferritin Panel - B12 and Folate Panel  2. Contraceptive use education Patient was having breakthrough bleeding on medroxyprogesterone was instructed to visit her  OB/GYN for further treatment  General Counseling: verbalizes understanding of the findings of todays visit and agrees with plan of treatment. I have discussed any further diagnostic evaluation that may be needed or ordered today. We also reviewed her medications today. she has been encouraged to call the office with any questions or concerns that should arise related to todays visit.    Orders Placed This Encounter  Procedures  CBC with Differential/Platelet   Iron, TIBC and Ferritin Panel   B12 and Folate Panel    No orders of the defined types were placed in this encounter.   Total time spent:28 Minutes Time spent includes review of chart, medications, test results, and follow up plan with the patient.   [place] Controlled Substance Database was reviewed by me.   Dr Lyndon Code Internal medicine

## 2022-03-17 ENCOUNTER — Telehealth: Payer: Self-pay

## 2022-03-17 NOTE — Telephone Encounter (Signed)
Pt called that she is not feeling good sinusitis ,coughing and congestion advised her go to urgent care

## 2022-03-28 DIAGNOSIS — Z419 Encounter for procedure for purposes other than remedying health state, unspecified: Secondary | ICD-10-CM | POA: Diagnosis not present

## 2022-03-30 ENCOUNTER — Ambulatory Visit: Payer: Medicaid Other | Admitting: Nurse Practitioner

## 2022-04-03 DIAGNOSIS — M545 Low back pain, unspecified: Secondary | ICD-10-CM | POA: Diagnosis not present

## 2022-04-03 DIAGNOSIS — M546 Pain in thoracic spine: Secondary | ICD-10-CM | POA: Diagnosis not present

## 2022-04-28 DIAGNOSIS — Z419 Encounter for procedure for purposes other than remedying health state, unspecified: Secondary | ICD-10-CM | POA: Diagnosis not present

## 2022-05-04 DIAGNOSIS — J069 Acute upper respiratory infection, unspecified: Secondary | ICD-10-CM | POA: Diagnosis not present

## 2022-05-27 DIAGNOSIS — Z419 Encounter for procedure for purposes other than remedying health state, unspecified: Secondary | ICD-10-CM | POA: Diagnosis not present

## 2022-06-20 IMAGING — US US OB FOLLOW-UP
1 series · 14 of 28 positions shown · non-contrast
Comparison: none

CLINICAL DATA: Size less than dates

EXAM:
OBSTETRIC 14+ WK ULTRASOUND FOLLOW-UP

[Series 1: us ob follow up · 31 acquisitions, 14 frames shown]
[im 2/31]
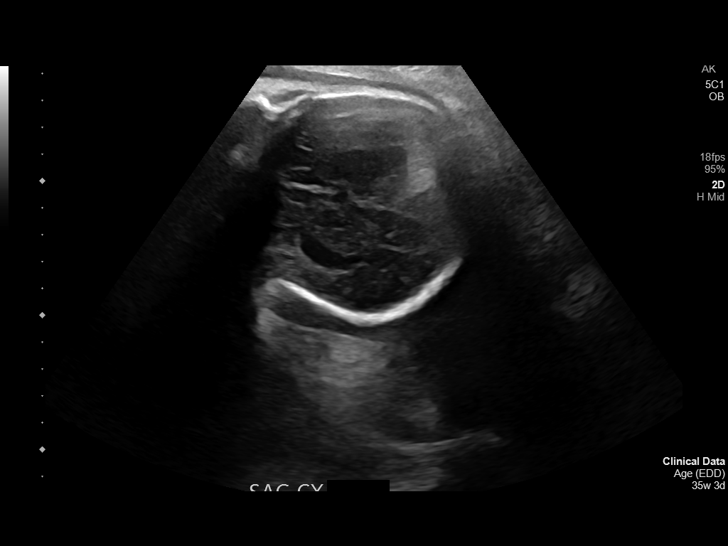
[im 4/31]
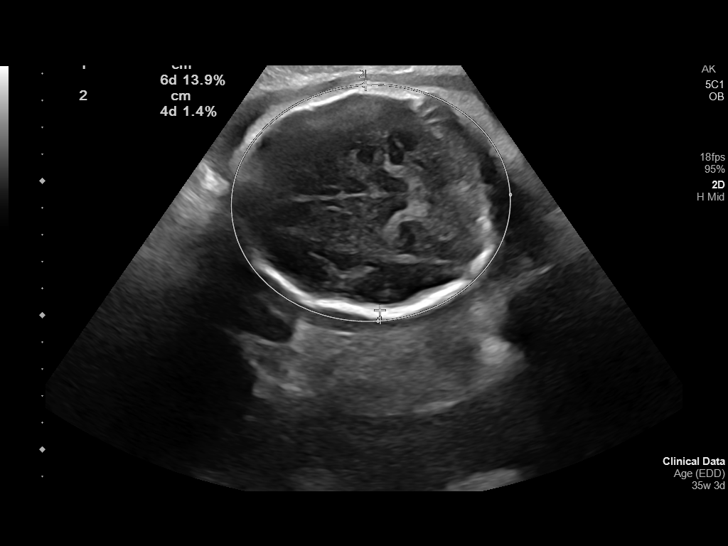
[im 6/31]
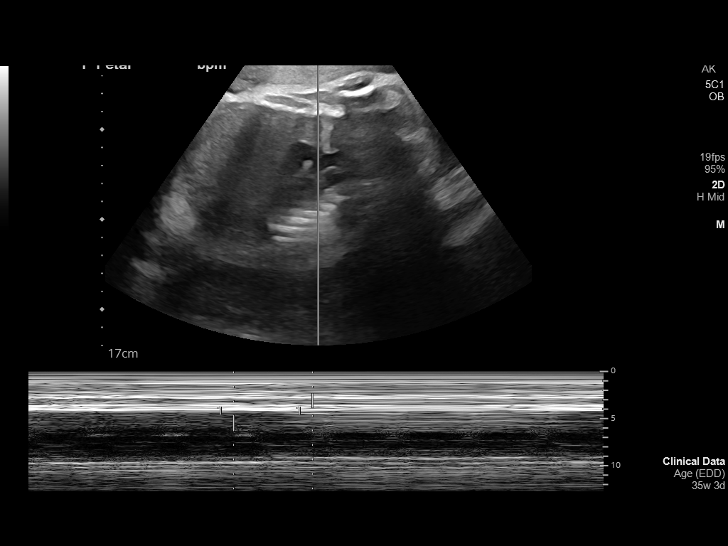
[im 8/31]
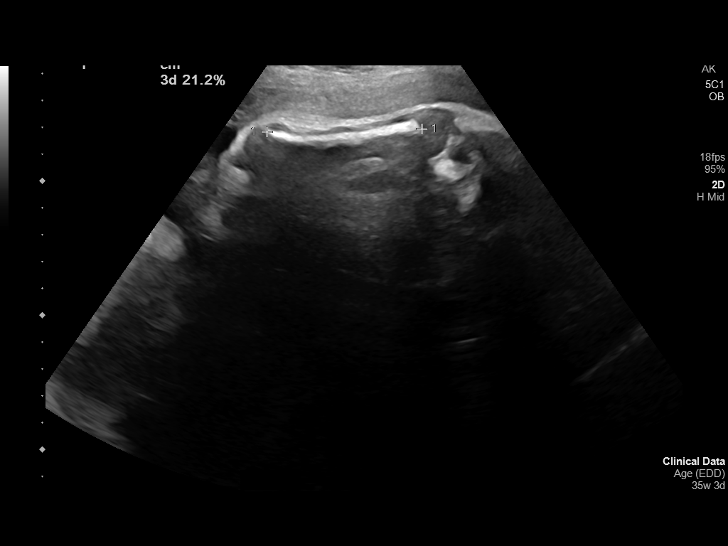
[im 11/31]
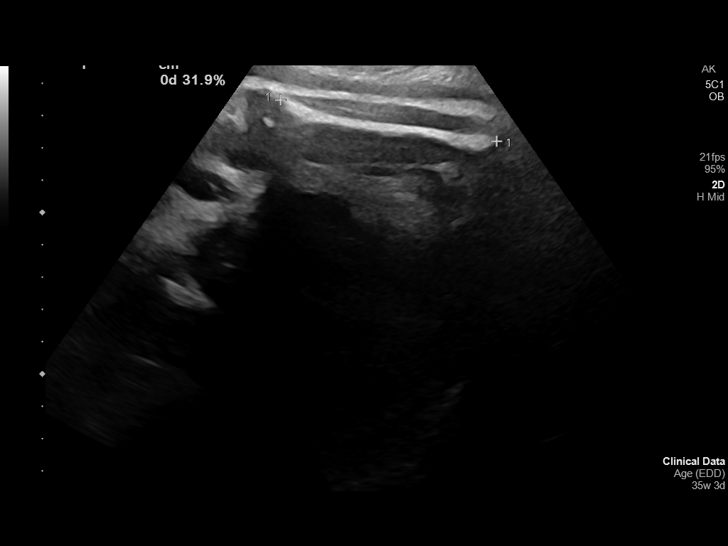
[im 13/31]
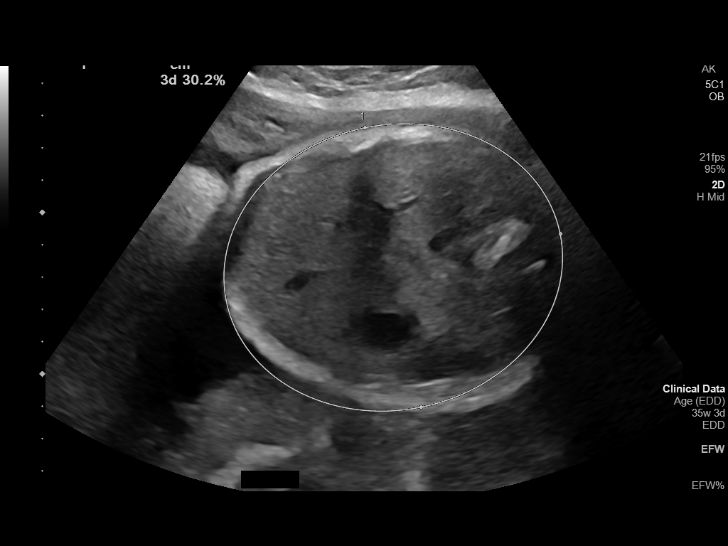
[im 15/31]
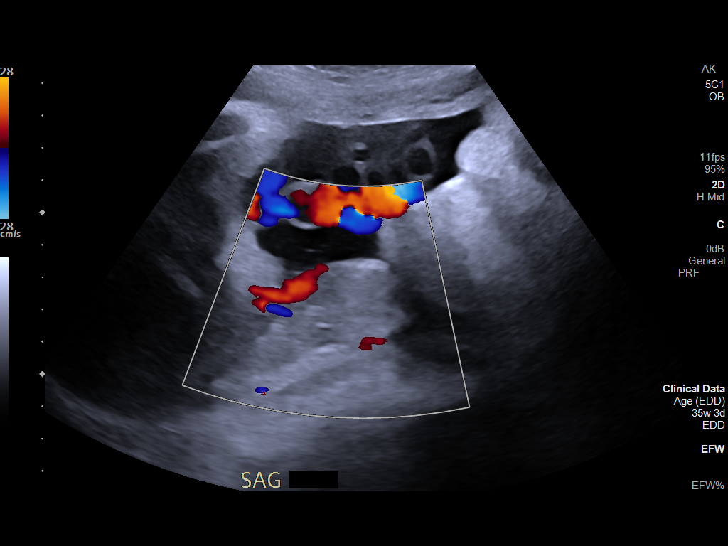
[im 17/31]
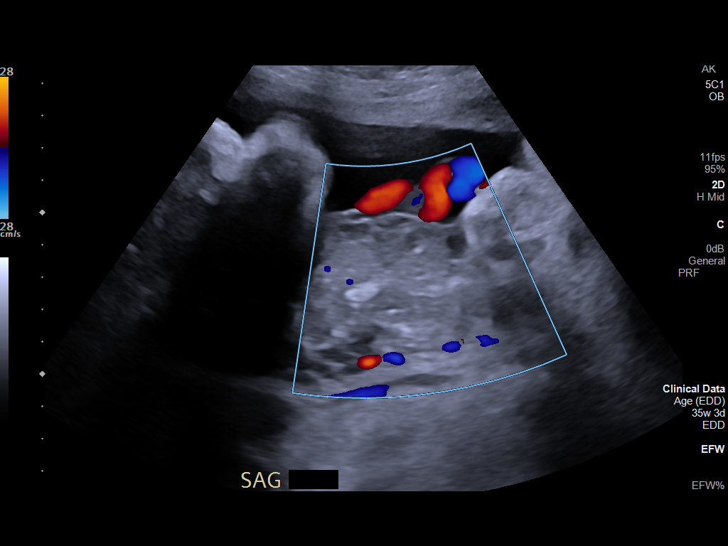
[im 19/31]
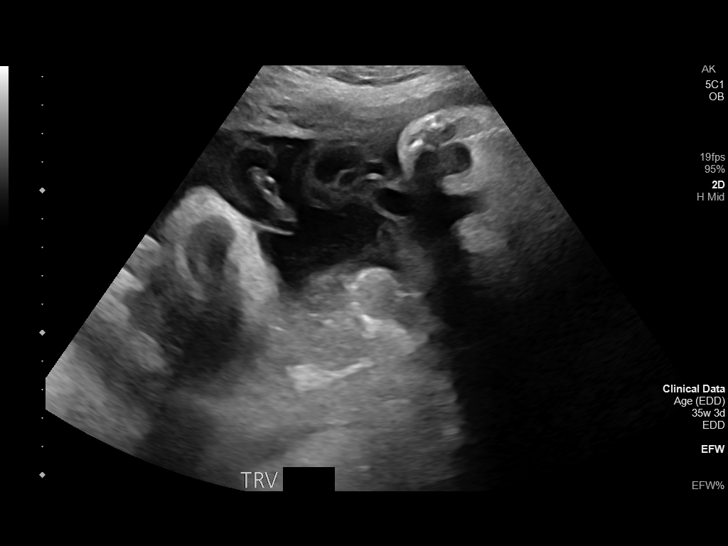
[im 22/31]
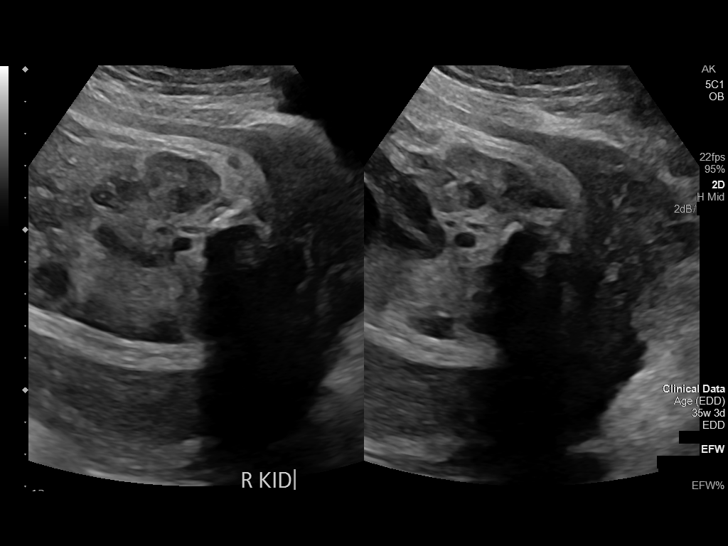
[im 24/31]
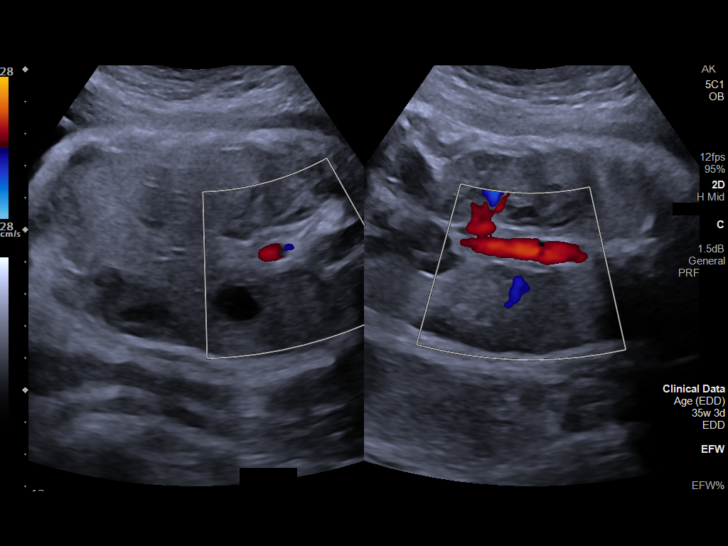
[im 26/31]
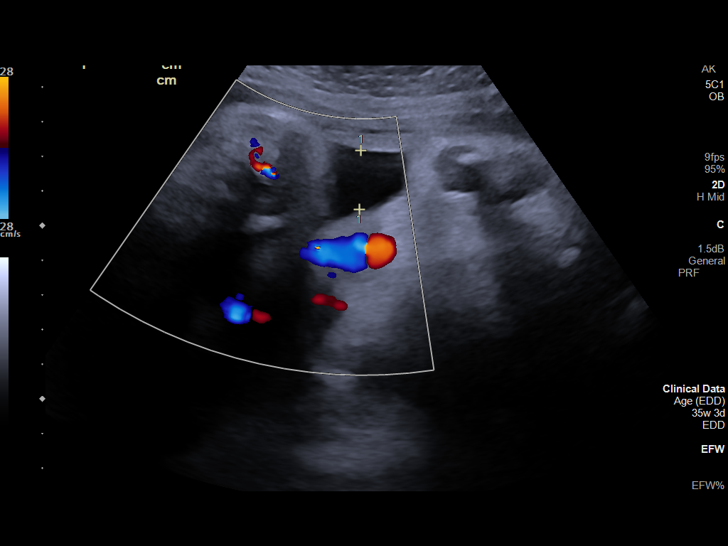
[im 28/31]
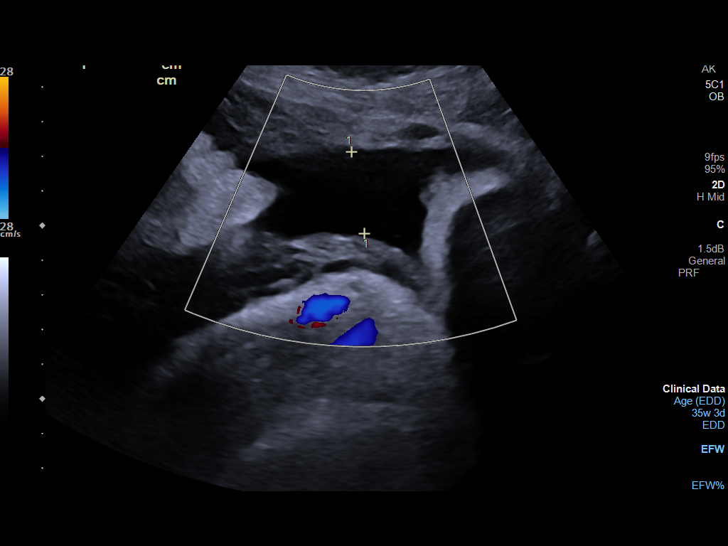
[im 31/31]
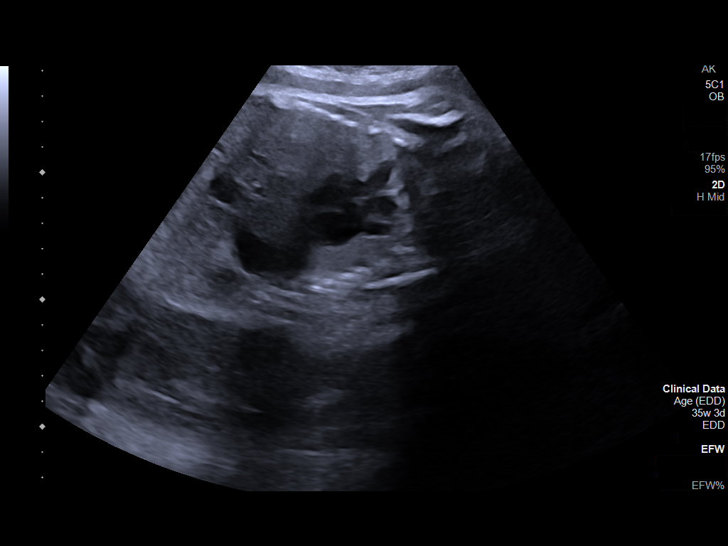

[14 of 28 positions shown; findings below may reference images not displayed]

FINDINGS: Number of Fetuses: 1

Heart Rate:  130 bpm

Movement: Yes

Presentation: Cephalic

Previa: No

Placental Location: Posterior

Amniotic Fluid (Subjective): Normal

Amniotic Fluid (Objective):

AFI 8.6 cm (5%ile= 7.9 cm, 95%= 24.9 cm for 35 wks)

FETAL BIOMETRY

BPD:  8.4cm 33w 5d

HC:    30.8cm 34w 2d

AC:    31.4cm 35w 2d

FL:    6.7cm 34w 3d

Current Mean GA: 34w 0d US EDC: 09/21/2021

Assigned GA: 35w 3d Assigned EDC: 09/11/2021

Estimated Fetal Weight:  2,517g 30.8%ile

FETAL ANATOMY

Complete fetal anatomic survey was done previously. Lateral
ventricles, thalami, stomach, kidneys, and bladder are well
visualized on this exam and unremarkable.

Technical Limitations: None

Maternal Findings:

Cervix:  Not evaluated
IMPRESSION: 1. Single live intrauterine pregnancy as above, estimated age 34
weeks and 0 days based on today's measurements.
2. Estimated fetal weight at the 30.8 %ile.
3. No fetal anomalies identified. Complete anatomic survey was
performed previously.

## 2022-06-27 DIAGNOSIS — Z419 Encounter for procedure for purposes other than remedying health state, unspecified: Secondary | ICD-10-CM | POA: Diagnosis not present

## 2022-07-27 DIAGNOSIS — Z419 Encounter for procedure for purposes other than remedying health state, unspecified: Secondary | ICD-10-CM | POA: Diagnosis not present

## 2022-08-27 DIAGNOSIS — Z419 Encounter for procedure for purposes other than remedying health state, unspecified: Secondary | ICD-10-CM | POA: Diagnosis not present

## 2022-09-26 DIAGNOSIS — Z419 Encounter for procedure for purposes other than remedying health state, unspecified: Secondary | ICD-10-CM | POA: Diagnosis not present

## 2022-10-27 DIAGNOSIS — Z419 Encounter for procedure for purposes other than remedying health state, unspecified: Secondary | ICD-10-CM | POA: Diagnosis not present

## 2022-11-01 DIAGNOSIS — R0789 Other chest pain: Secondary | ICD-10-CM | POA: Diagnosis not present

## 2022-11-01 DIAGNOSIS — J209 Acute bronchitis, unspecified: Secondary | ICD-10-CM | POA: Diagnosis not present

## 2022-11-02 ENCOUNTER — Emergency Department: Payer: Medicaid Other

## 2022-11-02 ENCOUNTER — Emergency Department
Admission: EM | Admit: 2022-11-02 | Discharge: 2022-11-02 | Discharge status: Against Medical Advice | Payer: Medicaid Other | Attending: Emergency Medicine | Admitting: Emergency Medicine

## 2022-11-02 ENCOUNTER — Other Ambulatory Visit: Payer: Self-pay

## 2022-11-02 DIAGNOSIS — R0789 Other chest pain: Secondary | ICD-10-CM | POA: Insufficient documentation

## 2022-11-02 DIAGNOSIS — R079 Chest pain, unspecified: Secondary | ICD-10-CM | POA: Diagnosis not present

## 2022-11-02 DIAGNOSIS — Z5321 Procedure and treatment not carried out due to patient leaving prior to being seen by health care provider: Secondary | ICD-10-CM | POA: Diagnosis not present

## 2022-11-02 DIAGNOSIS — J3489 Other specified disorders of nose and nasal sinuses: Secondary | ICD-10-CM | POA: Insufficient documentation

## 2022-11-02 LAB — CBC
HCT: 39.1 % (ref 36.0–46.0)
Hemoglobin: 12.5 g/dL (ref 12.0–15.0)
MCH: 27.2 pg (ref 26.0–34.0)
MCHC: 32 g/dL (ref 30.0–36.0)
MCV: 85.2 fL (ref 80.0–100.0)
Platelets: 291 10*3/uL (ref 150–400)
RBC: 4.59 MIL/uL (ref 3.87–5.11)
RDW: 14.2 % (ref 11.5–15.5)
WBC: 7.8 10*3/uL (ref 4.0–10.5)
nRBC: 0 % (ref 0.0–0.2)

## 2022-11-02 LAB — BASIC METABOLIC PANEL
Anion gap: 4 — ABNORMAL LOW (ref 5–15)
BUN: 11 mg/dL (ref 6–20)
CO2: 24 mmol/L (ref 22–32)
Calcium: 8.5 mg/dL — ABNORMAL LOW (ref 8.9–10.3)
Chloride: 108 mmol/L (ref 98–111)
Creatinine, Ser: 0.79 mg/dL (ref 0.44–1.00)
GFR, Estimated: >60 mL/min (ref >60)
Glucose, Bld: 102 mg/dL — ABNORMAL HIGH (ref 70–99)
Potassium: 3.3 mmol/L — ABNORMAL LOW (ref 3.5–5.1)
Sodium: 136 mmol/L (ref 135–145)

## 2022-11-02 LAB — TROPONIN I (HIGH SENSITIVITY): Troponin I (High Sensitivity): <2 ng/L (ref <18)

## 2022-11-02 NOTE — ED Notes (Signed)
No answer when called several times from lobby 

## 2022-11-02 NOTE — ED Triage Notes (Signed)
Pt to ED via POV from home. Pt reports having sinus pressure and turning into CP. Pt seen at [facility] yesterday and EKG and chest xray were negative but wants a second opinion. Pt reports CP is intermittent.

## 2022-11-03 ENCOUNTER — Inpatient Hospital Stay: Admission: RE | Admit: 2022-11-03 | Payer: Medicaid Other | Source: Ambulatory Visit

## 2022-11-27 DIAGNOSIS — Z419 Encounter for procedure for purposes other than remedying health state, unspecified: Secondary | ICD-10-CM | POA: Diagnosis not present

## 2022-12-27 DIAGNOSIS — Z419 Encounter for procedure for purposes other than remedying health state, unspecified: Secondary | ICD-10-CM | POA: Diagnosis not present

## 2023-01-02 DIAGNOSIS — R062 Wheezing: Secondary | ICD-10-CM | POA: Diagnosis not present

## 2023-01-02 DIAGNOSIS — J069 Acute upper respiratory infection, unspecified: Secondary | ICD-10-CM | POA: Diagnosis not present

## 2023-01-02 DIAGNOSIS — Z20822 Contact with and (suspected) exposure to covid-19: Secondary | ICD-10-CM | POA: Diagnosis not present

## 2023-01-27 DIAGNOSIS — Z419 Encounter for procedure for purposes other than remedying health state, unspecified: Secondary | ICD-10-CM | POA: Diagnosis not present

## 2023-02-26 DIAGNOSIS — Z419 Encounter for procedure for purposes other than remedying health state, unspecified: Secondary | ICD-10-CM | POA: Diagnosis not present

## 2023-03-29 DIAGNOSIS — Z419 Encounter for procedure for purposes other than remedying health state, unspecified: Secondary | ICD-10-CM | POA: Diagnosis not present

## 2023-04-09 DIAGNOSIS — Z419 Encounter for procedure for purposes other than remedying health state, unspecified: Secondary | ICD-10-CM | POA: Diagnosis not present

## 2023-04-17 ENCOUNTER — Ambulatory Visit: Payer: Self-pay

## 2023-04-17 ENCOUNTER — Ambulatory Visit: Payer: Medicaid Other

## 2023-04-18 ENCOUNTER — Ambulatory Visit: Payer: Medicaid Other

## 2023-04-29 DIAGNOSIS — Z419 Encounter for procedure for purposes other than remedying health state, unspecified: Secondary | ICD-10-CM | POA: Diagnosis not present

## 2023-05-08 ENCOUNTER — Ambulatory Visit: Payer: Medicaid Other

## 2023-05-27 DIAGNOSIS — Z419 Encounter for procedure for purposes other than remedying health state, unspecified: Secondary | ICD-10-CM | POA: Diagnosis not present

## 2023-07-08 DIAGNOSIS — Z419 Encounter for procedure for purposes other than remedying health state, unspecified: Secondary | ICD-10-CM | POA: Diagnosis not present

## 2023-08-07 DIAGNOSIS — Z419 Encounter for procedure for purposes other than remedying health state, unspecified: Secondary | ICD-10-CM | POA: Diagnosis not present

## 2023-09-07 DIAGNOSIS — Z419 Encounter for procedure for purposes other than remedying health state, unspecified: Secondary | ICD-10-CM | POA: Diagnosis not present

## 2023-10-07 DIAGNOSIS — Z419 Encounter for procedure for purposes other than remedying health state, unspecified: Secondary | ICD-10-CM | POA: Diagnosis not present

## 2023-10-27 ENCOUNTER — Ambulatory Visit

## 2023-10-28 ENCOUNTER — Ambulatory Visit
Admission: RE | Admit: 2023-10-28 | Discharge: 2023-10-28 | Disposition: A | Discharge status: Home / Self Care | Source: Ambulatory Visit | Attending: Emergency Medicine | Admitting: Emergency Medicine

## 2023-10-28 VITALS — BP 127/93 | HR 69 | Temp 98.5°F | Resp 17

## 2023-10-28 DIAGNOSIS — H6123 Impacted cerumen, bilateral: Secondary | ICD-10-CM | POA: Diagnosis not present

## 2023-10-28 DIAGNOSIS — H6503 Acute serous otitis media, bilateral: Secondary | ICD-10-CM | POA: Diagnosis not present

## 2023-10-28 MED ORDER — AMOXICILLIN-POT CLAVULANATE 875-125 MG PO TABS: 1.0000 | ORAL_TABLET | Freq: Two times a day (BID) | ORAL | 0 refills | Status: AC

## 2023-10-28 NOTE — ED Provider Notes (Signed)
 Amy [name]    CSN: [id number] Arrival date & time: 10/28/23  9061      History   Chief Complaint Chief Complaint  Patient presents with   Ear Fullness    There's pressure in my right ear and I can't hear - Entered by patient    HPI Amy [name] is a 28 y.o. female.   Patient presents for evaluation of right-sided ear fullness with decreased hearing beginning 2 to 3 days ago.  Recent swimming.  Denies ear drainage, fever or congestion.  Has attempted hydrogen peroxide which was ineffective.  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 Authorizing Provider  Fe Fum-Vit C-Vit B12-FA 200-250-0.01-1 MG CAPS Take 1 capsule by mouth daily at 8 pm. 08/11/21   Carlin Rollene HERO, CNM  Prenatal Vit-Fe Fumarate-FA (MULTIVITAMIN-PRENATAL) 27-0.8 MG TABS tablet Take 1 tablet by mouth daily at 12 noon.    [provider]    Family History Family History  Adopted: Yes  Problem Relation  Age of Onset   Depression Mother    Depression Sister    Premature birth Sister    Depression Sister    Depression Brother    Anxiety disorder Brother    Breast cancer Maternal Grandmother    Diabetes Mellitus II Maternal Grandmother     Social History Social History   Tobacco Use   Smoking status: Former    Current packs/day: 0.00    Average packs/day: 0.3 packs/day for 1 year (0.3 ttl pk-yrs)    Types: E-cigarettes, Cigarettes    Start date: 03/26/2017    Quit date: 04/04/2017    Years since quitting: 6.5   Smokeless tobacco: Never  Vaping Use   Vaping status: Every Day  Substance Use Topics   Alcohol use: Not Currently   Drug use: Not Currently    Types: Marijuana    Comment: had +MJ screen at [facility]     Allergies   Patient has no known allergies.   Review of Systems Review of Systems   Physical Exam Triage Vital Signs ED Triage Vitals  Encounter Vitals Group     BP 10/28/23 0949 (!) 127/93     Girls Systolic  BP Percentile --      Girls Diastolic BP Percentile --      Boys Systolic BP Percentile --      Boys Diastolic BP Percentile --      Pulse Rate 10/28/23 0949 69     Resp 10/28/23 0949 17     Temp 10/28/23 0949 98.5 F (36.9 C)     Temp Source 10/28/23 0949 Oral     SpO2 10/28/23 0949 98 %     Weight --      Height --      Head Circumference --      Peak Flow --      Pain Score 10/28/23 0947 0     Pain Loc --      Pain Education --      Exclude from Growth Chart --    No data found.  Updated Vital Signs BP (!) 127/93 (BP Location: Left Arm)   Pulse 69   Temp 98.5 F (36.9 C) (Oral)   Resp 17   SpO2 98%   Visual Acuity Right Eye Distance:   Left Eye Distance:   Bilateral Distance:    Right Eye Near:   Left Eye Near:    Bilateral Near:     Physical Exam Constitutional:      Appearance: Normal appearance.  HENT:     Right Ear: There is impacted cerumen.     Left Ear: There is impacted cerumen.  Eyes:     Extraocular Movements:  Extraocular movements intact.  Pulmonary:     Effort: Pulmonary effort is normal.  Neurological:     Mental Status: She is alert and oriented to person, place, and time.      UC Treatments / Results  Labs (all labs ordered are listed, but only abnormal results are displayed) Labs Reviewed - No data to display  EKG   Radiology No results found.  Procedures Procedures (including critical care time)  Medications Ordered in UC Medications - No data to display  Initial Impression / Assessment and Plan / UC Course  I have reviewed the triage vital signs and the nursing notes.  Pertinent labs & imaging results that were available during my care of the patient were reviewed by me and considered in my medical decision making (see chart for details).  Bilateral impacted cerumen nonrecurrent acute serous otitis media of both ears  Cerumen impaction noted on exam, discussed with patient, water irrigation completed by nursing staff, successful, on reevaluation erythema is present to both tympanic membranes the patient is tearful within exam room, discussed findings, initiated Augmentin  and recommended over-the-counter analgesics and warm compresses for supportive care, advised to follow-up if symptoms continue to persist or worsen Final Clinical Impressions(s) / UC Diagnoses   Final diagnoses:  Bilateral impacted cerumen     Discharge Instructions      Today you were treated for ear fullness due to buildup of wax, ears have been irrigated with water  Moving forward you may use over-the-counter Debrox drops to help thin secretions making it easier to clean   may follow-up with his urgent care as needed if fullness recurs    ED Prescriptions   None    PDMP not reviewed this encounter.   Teresa Shelba SAUNDERS, NP 10/28/23 1030

## 2023-10-28 NOTE — ED Triage Notes (Signed)
 Patient state that she went to the beach a few days ago and now can't hear right ear. No pain. Just fullness. Right ear has wax.

## 2023-10-28 NOTE — Discharge Instructions (Addendum)
 Today you were treated for ear fullness due to buildup of wax, ears have been irrigated with water  Reevaluation of your ears both sides are infected  Take Augmentin  twice daily for 7 days for treatment  All wax has been removed, do not attempt to clean ears during treatment and until all symptoms have resolved  Moving forward you may use over-the-counter Debrox drops to help thin secretions making it easier to clean  Tylenol  and or Motrin  as needed for pain  Follow-up with his urgent care if symptoms do not improve

## 2023-11-07 DIAGNOSIS — Z419 Encounter for procedure for purposes other than remedying health state, unspecified: Secondary | ICD-10-CM | POA: Diagnosis not present

## 2023-12-08 DIAGNOSIS — Z419 Encounter for procedure for purposes other than remedying health state, unspecified: Secondary | ICD-10-CM | POA: Diagnosis not present
